# Patient Record
Sex: Female | Born: 1965 | Race: White | Hispanic: No | Smoking: Current every day smoker
Health system: Southern US, Community
[De-identification: ages and names within clinical notes are randomized; demographics above are authoritative.]

---

## 1999-11-19 ENCOUNTER — Encounter: Payer: Self-pay | Admitting: Obstetrics and Gynecology

## 1999-11-19 ENCOUNTER — Inpatient Hospital Stay (HOSPITAL_COMMUNITY): Admission: AD | Admit: 1999-11-19 | Discharge: 1999-11-19 | Payer: Self-pay | Admitting: Obstetrics and Gynecology

## 1999-12-22 ENCOUNTER — Other Ambulatory Visit: Admission: RE | Admit: 1999-12-22 | Discharge: 1999-12-22 | Payer: Self-pay | Admitting: Obstetrics & Gynecology

## 2000-04-18 ENCOUNTER — Inpatient Hospital Stay (HOSPITAL_COMMUNITY): Admission: AD | Admit: 2000-04-18 | Discharge: 2000-04-18 | Payer: Self-pay | Admitting: Obstetrics & Gynecology

## 2000-04-19 ENCOUNTER — Inpatient Hospital Stay (HOSPITAL_COMMUNITY): Admission: AD | Admit: 2000-04-19 | Discharge: 2000-04-19 | Payer: Self-pay | Admitting: Obstetrics & Gynecology

## 2000-06-17 ENCOUNTER — Encounter: Payer: Self-pay | Admitting: Obstetrics and Gynecology

## 2000-06-17 ENCOUNTER — Observation Stay (HOSPITAL_COMMUNITY): Admission: AD | Admit: 2000-06-17 | Discharge: 2000-06-18 | Payer: Self-pay | Admitting: Obstetrics and Gynecology

## 2000-06-18 ENCOUNTER — Inpatient Hospital Stay (HOSPITAL_COMMUNITY): Admission: AD | Admit: 2000-06-18 | Discharge: 2000-06-18 | Payer: Self-pay | Admitting: Obstetrics and Gynecology

## 2000-07-01 ENCOUNTER — Inpatient Hospital Stay (HOSPITAL_COMMUNITY): Admission: AD | Admit: 2000-07-01 | Discharge: 2000-07-03 | Payer: Self-pay | Admitting: *Deleted

## 2000-07-04 ENCOUNTER — Encounter: Admission: RE | Admit: 2000-07-04 | Discharge: 2000-10-02 | Payer: Self-pay | Admitting: Obstetrics & Gynecology

## 2000-08-05 ENCOUNTER — Other Ambulatory Visit: Admission: RE | Admit: 2000-08-05 | Discharge: 2000-08-05 | Payer: Self-pay | Admitting: Obstetrics & Gynecology

## 2001-09-25 ENCOUNTER — Other Ambulatory Visit: Admission: RE | Admit: 2001-09-25 | Discharge: 2001-09-25 | Payer: Self-pay | Admitting: Obstetrics & Gynecology

## 2001-11-26 ENCOUNTER — Encounter: Admission: RE | Admit: 2001-11-26 | Discharge: 2001-11-26 | Payer: Self-pay | Admitting: Internal Medicine

## 2001-11-26 ENCOUNTER — Encounter: Payer: Self-pay | Admitting: Internal Medicine

## 2001-12-03 ENCOUNTER — Encounter: Payer: Self-pay | Admitting: Surgery

## 2001-12-03 ENCOUNTER — Observation Stay (HOSPITAL_COMMUNITY): Admission: RE | Admit: 2001-12-03 | Discharge: 2001-12-03 | Payer: Self-pay | Admitting: Surgery

## 2001-12-03 ENCOUNTER — Encounter (INDEPENDENT_AMBULATORY_CARE_PROVIDER_SITE_OTHER): Payer: Self-pay | Admitting: Specialist

## 2002-11-17 ENCOUNTER — Other Ambulatory Visit: Admission: RE | Admit: 2002-11-17 | Discharge: 2002-11-17 | Payer: Self-pay | Admitting: Obstetrics & Gynecology

## 2003-12-17 ENCOUNTER — Other Ambulatory Visit: Admission: RE | Admit: 2003-12-17 | Discharge: 2003-12-17 | Payer: Self-pay | Admitting: Obstetrics & Gynecology

## 2004-08-22 ENCOUNTER — Observation Stay (HOSPITAL_COMMUNITY): Admission: AD | Admit: 2004-08-22 | Discharge: 2004-08-23 | Payer: Self-pay | Admitting: Obstetrics & Gynecology

## 2004-08-22 ENCOUNTER — Encounter (INDEPENDENT_AMBULATORY_CARE_PROVIDER_SITE_OTHER): Payer: Self-pay | Admitting: Specialist

## 2005-01-26 ENCOUNTER — Encounter: Admission: RE | Admit: 2005-01-26 | Discharge: 2005-01-26 | Payer: Self-pay | Admitting: Internal Medicine

## 2007-06-25 ENCOUNTER — Emergency Department (HOSPITAL_COMMUNITY): Admission: EM | Admit: 2007-06-25 | Discharge: 2007-06-25 | Payer: Self-pay | Admitting: Emergency Medicine

## 2008-10-21 ENCOUNTER — Encounter: Admission: RE | Admit: 2008-10-21 | Discharge: 2008-10-21 | Payer: Self-pay | Admitting: Internal Medicine

## 2009-02-03 ENCOUNTER — Inpatient Hospital Stay (HOSPITAL_COMMUNITY): Admission: EM | Admit: 2009-02-03 | Discharge: 2009-02-06 | Payer: Self-pay | Admitting: Emergency Medicine

## 2010-01-23 ENCOUNTER — Emergency Department (HOSPITAL_COMMUNITY): Admission: EM | Admit: 2010-01-23 | Discharge: 2010-01-23 | Payer: Self-pay | Admitting: Emergency Medicine

## 2010-03-01 IMAGING — CT CT PELVIS W/ CM
2 of 5 series · 13 of 32 positions shown, 18 images · IV contrast (water- bmi proto & 100 ML OMNI 300)
Comparison: 01/26/2005.

CT ABDOMEN

CLINICAL DATA: Left lower quadrant pain, nausea, vomiting, fever,
headache, question diverticulitis

CT ABDOMEN AND PELVIS WITH CONTRAST
TECHNIQUE: Multidetector CT imaging of the abdomen and pelvis was
performed using the standard protocol following bolus
administration of intravenous contrast. Sagittal and coronal MPR
images reconstructed from axial data set.
Contrast: Water as oral contrast and 100 ml Zmnipaque-VFF IV.

[Series 2: routine abdomen · axial · 0.91mm/px · z∈[-460,-120]mm · 5 of 103 slices shown, 10 images]
[im 18/103  soft-tissue]
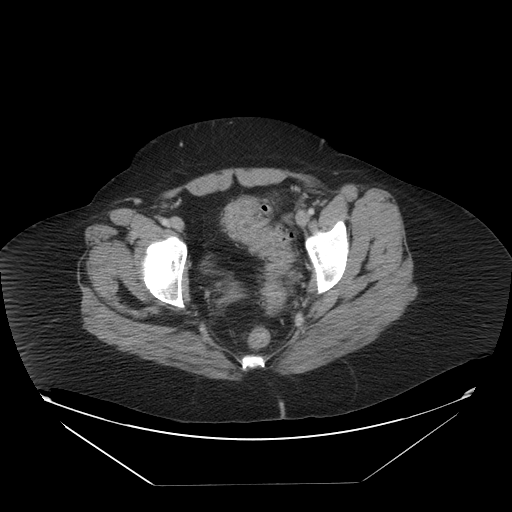
[im 18/103  bone]
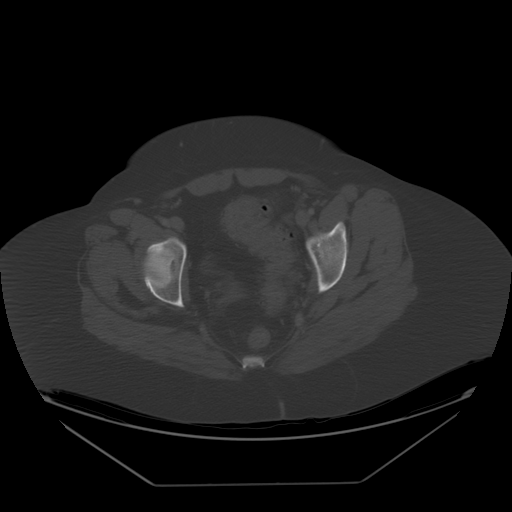
[im 35/103  soft-tissue]
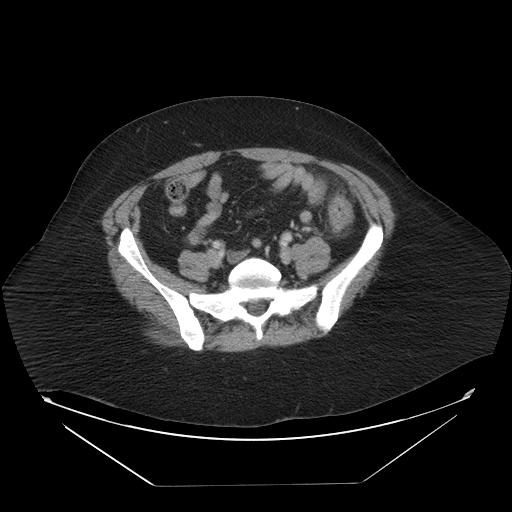
[im 35/103  lung]
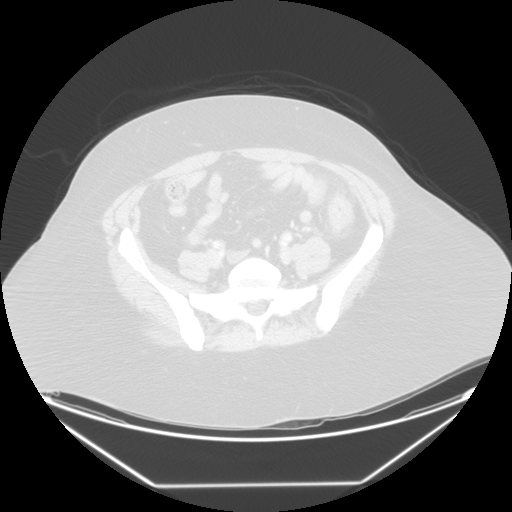
[im 52/103  soft-tissue]
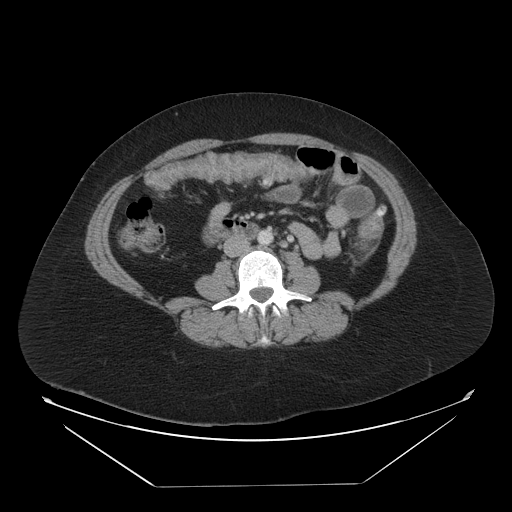
[im 52/103  lung]
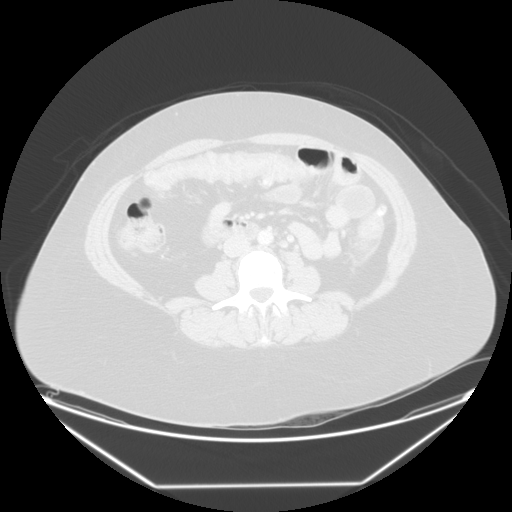
[im 69/103  soft-tissue]
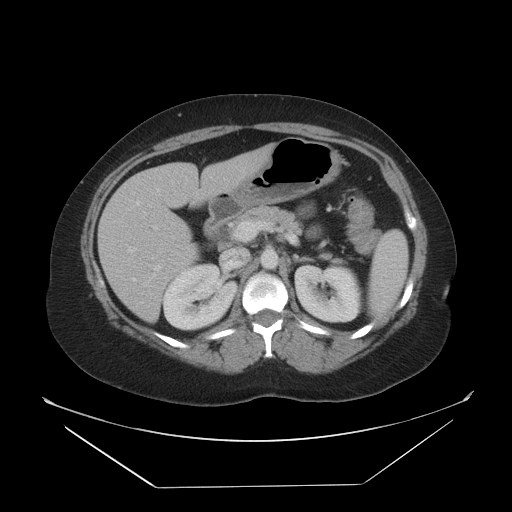
[im 69/103  lung]
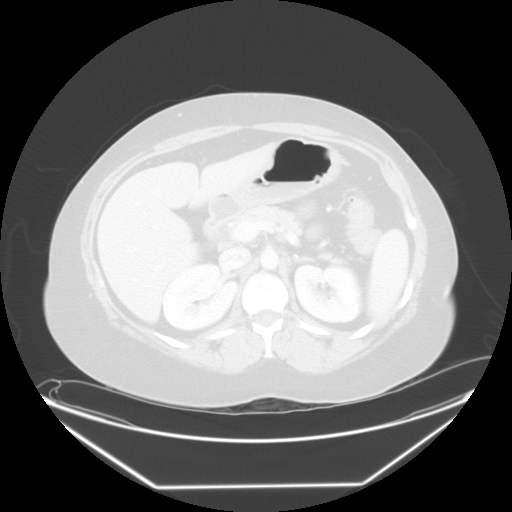
[im 86/103  soft-tissue]
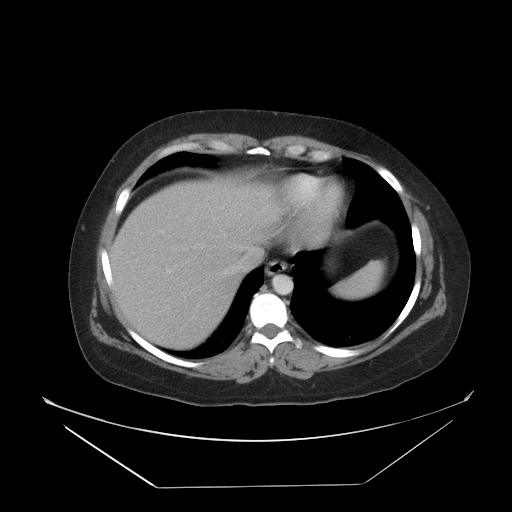
[im 86/103  lung]
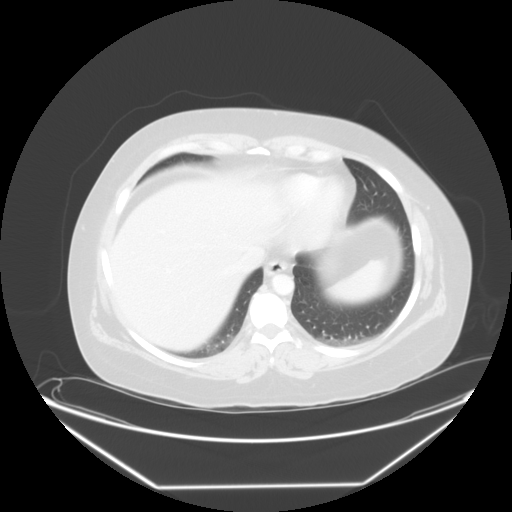

[Series 400: reformatted · sagittal · 1.02mm/px · 8 of 137 slices shown]
[im 16/137  soft-tissue]
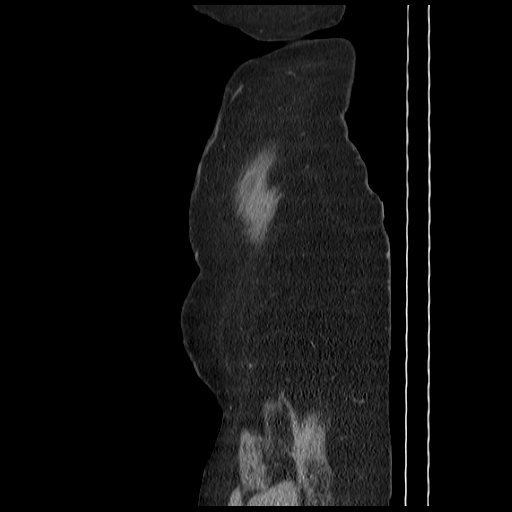
[im 31/137  soft-tissue]
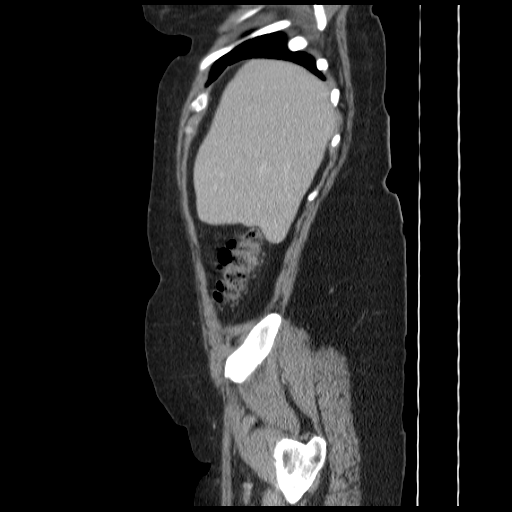
[im 46/137  soft-tissue]
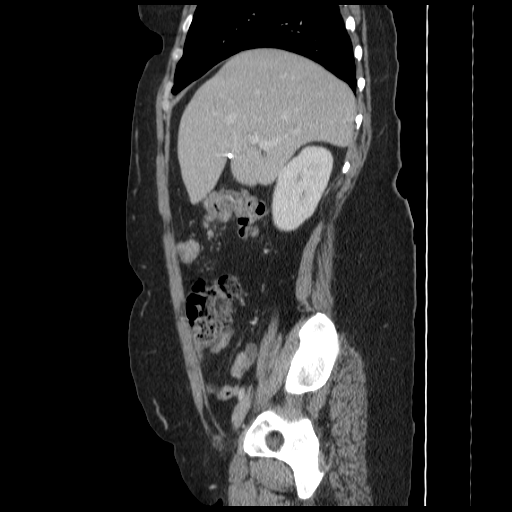
[im 61/137  soft-tissue]
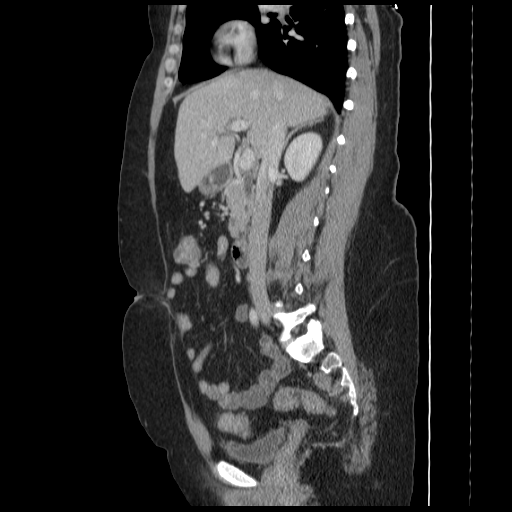
[im 76/137  soft-tissue]
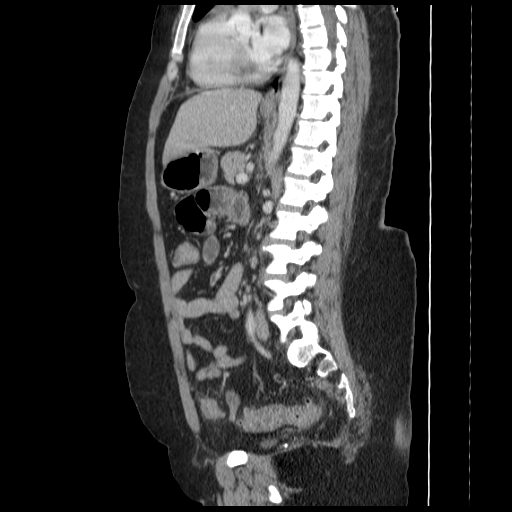
[im 91/137  soft-tissue]
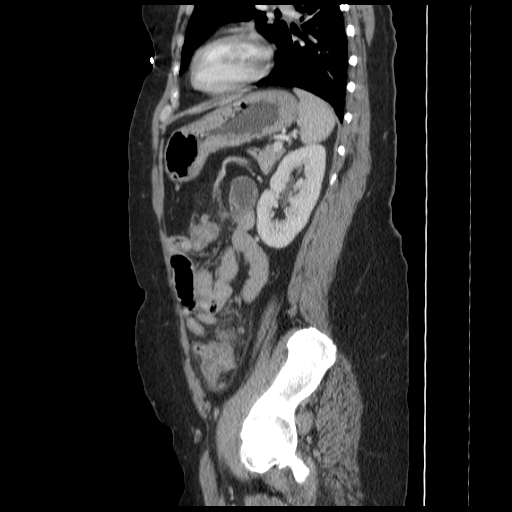
[im 106/137  soft-tissue]
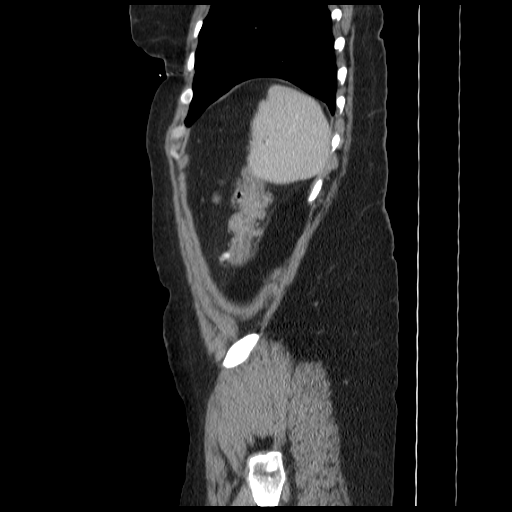
[im 121/137  soft-tissue]
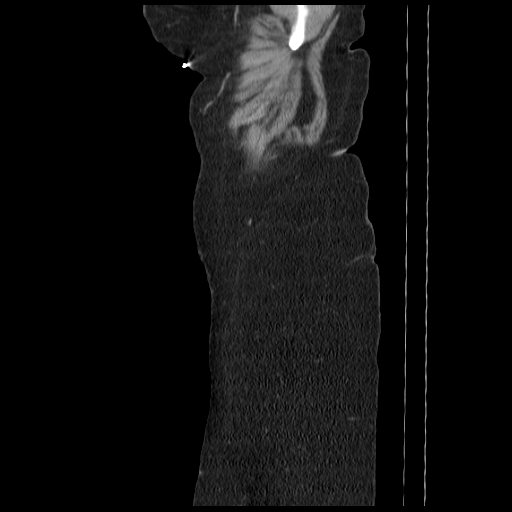

[13 of 32 positions shown; findings below may reference images not displayed]

FINDINGS: Minimal dependent density at lung bases bilaterally.
Status post cholecystectomy.
Liver, spleen, pancreas, kidneys, and adrenal glands normal.
Retroaortic left renal vein incidentally noted.
Scattered diverticula of transverse, descending, and sigmoid colon.
However, in addition, diffuse wall thickening of colon is
identified from proximal transverse colon into sigmoid colon.
Length of involvement makes this unlikely to represent
diverticulitis and is most suspicious for colitis.
Intra abdominal vascular structures patent.
No definite evidence of perforation or abscess.
Scattered pericolic edema/infiltrative changes noted.
Small bowel loops unremarkable.
No mass, adenopathy, or ascites.
IMPRESSION: Diffuse colitis extending from proximal transverse colon into
sigmoid colon, consider infectious etiologies and inflammatory
bowel disease, ischemia considered unlikely in patient of this age
lacking vascular disease findings.
Scattered colonic diverticula though the length of involvement of
the colonic wall edema/thickening makes this unlikely to represent
acute diverticulitis.

CT PELVIS
FINDINGS: Colonic wall thickening extends from distal descending colon
through sigmoid colon.
Scattered colonic diverticula.
Pericolic inflammatory changes without discrete abscess or extra
colonic gas.
Small bowel loops appendix normal.
No pelvic mass, adenopathy, or free fluid.
No focal bony abnormalities.
IMPRESSION: Colitis extending from the proximal transverse through sigmoid
colon, diverticulitis considered unlikely due to length of
involvement, see discussion and CT abdomen impression.

## 2011-02-05 LAB — URINALYSIS, ROUTINE W REFLEX MICROSCOPIC
Bilirubin Urine: NEGATIVE
Glucose, UA: 100 mg/dL — AB
Ketones, ur: NEGATIVE mg/dL
Protein, ur: NEGATIVE mg/dL
Urobilinogen, UA: 0.2 mg/dL (ref 0.0–1.0)

## 2011-02-05 LAB — CBC
Hemoglobin: 14.3 g/dL (ref 12.0–15.0)
MCHC: 34.5 g/dL (ref 30.0–36.0)
MCV: 96.3 fL (ref 78.0–100.0)
RBC: 4.3 MIL/uL (ref 3.87–5.11)
WBC: 14.4 10*3/uL — ABNORMAL HIGH (ref 4.0–10.5)

## 2011-02-05 LAB — DIFFERENTIAL
Eosinophils Absolute: 0 10*3/uL (ref 0.0–0.7)
Lymphs Abs: 0.7 10*3/uL (ref 0.7–4.0)
Monocytes Absolute: 0.5 10*3/uL (ref 0.1–1.0)
Monocytes Relative: 3 % (ref 3–12)
Neutrophils Relative %: 92 % — ABNORMAL HIGH (ref 43–77)

## 2011-02-05 LAB — BASIC METABOLIC PANEL
CO2: 25 mEq/L (ref 19–32)
Chloride: 105 mEq/L (ref 96–112)
Creatinine, Ser: 0.74 mg/dL (ref 0.4–1.2)
GFR calc Af Amer: >60 mL/min (ref >60)
Potassium: 3.7 mEq/L (ref 3.5–5.1)
Sodium: 137 mEq/L (ref 135–145)

## 2011-02-05 LAB — URINE MICROSCOPIC-ADD ON

## 2011-02-22 LAB — BASIC METABOLIC PANEL
BUN: 1 mg/dL — ABNORMAL LOW (ref 6–23)
BUN: <1 mg/dL — ABNORMAL LOW (ref 6–23)
CO2: 29 mEq/L (ref 19–32)
Calcium: 8.5 mg/dL (ref 8.4–10.5)
Chloride: 107 mEq/L (ref 96–112)
Creatinine, Ser: 0.45 mg/dL (ref 0.4–1.2)
GFR calc Af Amer: >60 mL/min (ref >60)
GFR calc non Af Amer: >60 mL/min (ref >60)
GFR calc non Af Amer: >60 mL/min (ref >60)
Glucose, Bld: 102 mg/dL — ABNORMAL HIGH (ref 70–99)
Glucose, Bld: 192 mg/dL — ABNORMAL HIGH (ref 70–99)
Potassium: 3.9 mEq/L (ref 3.5–5.1)
Sodium: 139 mEq/L (ref 135–145)
Sodium: 140 mEq/L (ref 135–145)

## 2011-02-22 LAB — URINALYSIS, ROUTINE W REFLEX MICROSCOPIC
Bilirubin Urine: NEGATIVE
Glucose, UA: NEGATIVE mg/dL
Protein, ur: NEGATIVE mg/dL
Specific Gravity, Urine: 1.026 (ref 1.005–1.030)

## 2011-02-22 LAB — POCT I-STAT, CHEM 8
BUN: 7 mg/dL (ref 6–23)
Chloride: 102 mEq/L (ref 96–112)
Creatinine, Ser: 0.7 mg/dL (ref 0.4–1.2)
Hemoglobin: 14.6 g/dL (ref 12.0–15.0)
Potassium: 3.7 mEq/L (ref 3.5–5.1)
Sodium: 139 mEq/L (ref 135–145)

## 2011-02-22 LAB — URINE MICROSCOPIC-ADD ON

## 2011-02-22 LAB — PHOSPHORUS: Phosphorus: 2.2 mg/dL — ABNORMAL LOW (ref 2.3–4.6)

## 2011-02-22 LAB — RAPID URINE DRUG SCREEN, HOSP PERFORMED
Barbiturates: NOT DETECTED
Opiates: POSITIVE — AB

## 2011-02-22 LAB — COMPREHENSIVE METABOLIC PANEL
Albumin: 2.8 g/dL — ABNORMAL LOW (ref 3.5–5.2)
BUN: 5 mg/dL — ABNORMAL LOW (ref 6–23)
Calcium: 8.2 mg/dL — ABNORMAL LOW (ref 8.4–10.5)
Chloride: 104 mEq/L (ref 96–112)
Creatinine, Ser: 0.55 mg/dL (ref 0.4–1.2)
GFR calc non Af Amer: >60 mL/min (ref >60)
Total Bilirubin: 0.7 mg/dL (ref 0.3–1.2)

## 2011-02-22 LAB — FECAL LACTOFERRIN, QUANT

## 2011-02-22 LAB — DIFFERENTIAL
Basophils Absolute: 0 10*3/uL (ref 0.0–0.1)
Lymphocytes Relative: 24 % (ref 12–46)
Lymphocytes Relative: 9 % — ABNORMAL LOW (ref 12–46)
Lymphs Abs: 1.4 10*3/uL (ref 0.7–4.0)
Monocytes Absolute: 0.4 10*3/uL (ref 0.1–1.0)
Neutro Abs: 13.6 10*3/uL — ABNORMAL HIGH (ref 1.7–7.7)
Neutro Abs: 3.9 10*3/uL (ref 1.7–7.7)
Neutrophils Relative %: 85 % — ABNORMAL HIGH (ref 43–77)

## 2011-02-22 LAB — CBC
Hemoglobin: 12.1 g/dL (ref 12.0–15.0)
Platelets: 207 10*3/uL (ref 150–400)
Platelets: 248 10*3/uL (ref 150–400)
RDW: 12.5 % (ref 11.5–15.5)
WBC: 16 10*3/uL — ABNORMAL HIGH (ref 4.0–10.5)

## 2011-02-22 LAB — CLOSTRIDIUM DIFFICILE EIA

## 2011-02-22 LAB — STOOL CULTURE

## 2011-02-22 LAB — URINE CULTURE

## 2011-02-22 LAB — CULTURE, BLOOD (ROUTINE X 2)

## 2011-02-22 LAB — CARDIAC PANEL(CRET KIN+CKTOT+MB+TROPI)
Relative Index: INVALID (ref 0.0–2.5)
Total CK: 15 U/L (ref 7–177)
Troponin I: 0.03 ng/mL (ref 0.00–0.06)

## 2011-02-22 LAB — APTT: aPTT: 36 seconds (ref 24–37)

## 2011-02-22 LAB — MAGNESIUM
Magnesium: 1.7 mg/dL (ref 1.5–2.5)
Magnesium: 1.9 mg/dL (ref 1.5–2.5)

## 2011-02-22 LAB — LIPID PANEL
HDL: 33 mg/dL — ABNORMAL LOW (ref >39)
LDL Cholesterol: 105 mg/dL — ABNORMAL HIGH (ref 0–99)
Triglycerides: 73 mg/dL (ref <150)

## 2011-02-22 LAB — PROTIME-INR: Prothrombin Time: 14.4 seconds (ref 11.6–15.2)

## 2011-02-22 LAB — HOMOCYSTEINE: Homocysteine: 3.1 umol/L — ABNORMAL LOW (ref 4.0–15.4)

## 2011-02-22 LAB — HEMOGLOBIN A1C: Mean Plasma Glucose: 120 mg/dL

## 2011-03-27 NOTE — Discharge Summary (Signed)
[name], [name]               ACCOUNT NO.:  [id number]   MEDICAL RECORD NO.:  [id number]          PATIENT TYPE:  INP   LOCATION:  6704                         FACILITY:  [facility]   PHYSICIAN:  Charlestine Massed, MDDATE OF BIRTH:  Nov 02, 1966   DATE OF ADMISSION:  02/02/2009  DATE OF DISCHARGE:                               DISCHARGE SUMMARY   PRIMARY CARE PHYSICIAN:  Dr. Elmore Guise.   REASON FOR ADMISSION:  Abdominal pain and diarrhea.   DISCHARGE DIAGNOSES:  1. Clostridium difficile infection.  2. Pancolitis, resolving.  3. Existing diagnosis of depression.  4. Existing of esophageal reflux disease.  5. Tobacco abuse - patient willing to stop smoking.   DISCHARGE MEDICATIONS:  1. Vancomycin 250 mg p.o. 4 x a day for 11 days.  2. Flagyl 500 mg p.o. t.i.d. for 11 days.  3. Nicotine patch 14 mg per day for 10 days and then 5 mg for 10 days      and then stop.  4. Mucinex 600 mg p.o. b.i.d.  5. Continue the existing medications of Effexor 300 mg p.o. daily.  6. BuSpar 15 mg p.o. b.i.d.  7. Nexium 40 mg p.o. daily.  8. Neurontin 600 mg t.i.d.  9. Potassium chloride 20 meq PO daily for 5 days  10.Magnesium Oxide 400 mg P.O BID for 5 days.   MEDICATIONS STOPPED:  Diamox.   HOSPITAL COURSE:  1. Clostridium difficile infection with pancolitis.  The patient was      initially admitted as she complained of a possible stomach virus      with worsening abdominal pain.  She was evaluated in the emergency      room and she was found to have nausea, vomiting and diarrhea with      fever.  A CAT scan of the abdomen showed diffuse colitis throughout      the colon.  She remembers having a similar episode 15 years ago.      At that time, she was treated with antibiotics.  So, the patient      was started on antibiotics. The patient stated that she took      antibiotics for upper respiratory infection/lower respiratory      infection one week ago.  Clostridium difficile infection was    suspected because of the recent antibiotic intake and also because      of the presence of diffuse pancolitis on CAT scan, which is usually      a classical feature of C diff colitis.  Her stool studies became      positive for C diff colitis and so the patient was started on      Flagyl.  She was continued on the Flagyl for fever, which she was      started on at admission even though the pancolitis picture on the      patient with diffuse tenderness and the high potential of      Clostridium difficile infection with pancolitis to develop into      fulminant sepsis which has a high mortality rate. and so she was  also started on vancomycin p.o. in addition to Flagyl considering      the severity of the infection.  The patient recovered considerably      with antibiotics.  Abdominal pain decreased considerably.  She has      been taking p.o. feeds.  Electrolytes have been stable so far and      so the patient is being discharged home on this medication as      mentioned.  2. History of gastroesophageal reflux disease:  Continue Nexium.  3. History of depression:  Continue Effexor and BuSpar which she has      taken before and also continue the Neurontin which she was      prescribed before.  4. Electrolyte issues:  The patient has been holding to normal      electrolyte levels, but she has been getting continuous replacement      by p.o. so we will continue replacement at home for the next few      days.   DISCHARGE LABS:  WBC:  CBC 5.9, hemoglobin 12.1, hematocrit 34.7 and  platelets 207.  Neutrophils 66%.  WBC was 16,000 with 85% neutrophils at  the time of admission.  BMP:  Sodium 140, potassium 4.3, chloride 107,  bicarb 30, BUN less than 1, creatinine 0.41 and glucose 102.  Calcium  8.5, magnesium 1.7.   Lipids done during the admission:  Total cholesterol 153, LDL 105,  triglycerides 73, HDL 33.  Homocystine level was 3.1 which is low.   The patient has been advised  proper diet intake and to avoid smoking and  the patient agrees with the diet and exercise for that.   DISCHARGE DIAGNOSES:  The patient is discharged back home to follow up  with Dr. Elmore Guise within 2 to 3 days.  He needs to get lab work for [facility]  and magnesium and phosphorus at his office in 3 days.   DISCHARGE INSTRUCTIONS:  1. Take plenty of p.o. fluids.  She has been advised to drink      Gatorade.  2. Take the electrolyte supplements prescribed and also to keep well      hydrated.  3. Follow up with Dr. Chilton Si within 2 to 3 days for lab tests.   A total of 45 minutes was spent on this discharge.      Charlestine Massed, MD  Electronically Signed     UT/MEDQ  D:  02/06/2009  T:  02/06/2009  Job:  [id number]   cc:   Renae Fickle, MD Chilton Si

## 2011-03-27 NOTE — H&P (Signed)
[name], [name]               ACCOUNT NO.:  [id number]   MEDICAL RECORD NO.:  [id number]          PATIENT TYPE:  INP   LOCATION:  6704                         FACILITY:  [facility]   PHYSICIAN:  Carlena Hurl, MDDATE OF BIRTH:  01/22/1966   DATE OF ADMISSION:  02/02/2009  DATE OF DISCHARGE:                              HISTORY & PHYSICAL   CHIEF COMPLAINT:  Stomach virus for the past 3 days.   HISTORY OF PRESENT ILLNESS:  This is a 45 year old Caucasian lady who  has the past medical history significant for colitis about 15 years ago,  and history of GERD now coming in with 3 days episode of nausea,  vomiting, and diarrhea.  History is obtained from the patient.  According to her, on Tuesday, that is 3 days ago, when she got up in the  morning, she started having loose stools, and also continued with loose  stools.  She continued to vomit as well.  She says that whatever she  ate, just coming out through vomitus, and she almost had about 10-12  episodes of loose stools per day, and for the past 3 days.  She has not  noticed any blood in the stools and initially she noticed diffuse  abdominal pain along with the diarrhea, and vomiting, and later her  abdominal pain settled down in the left lower quadrant.  She denies any  travel history recently.  Denies eating any different food, and she  denies having any contacts with sick people recently.  She says that  about 15 years ago, she had similar symptoms of nausea, vomiting, and  diarrhea and had colonoscopy with the GI physician, and was told to have  colitis.  She does not remember exactly what type of colitis, and she  remembers that she was given antibiotics, and then she felt better.  After 15 years now, she again has similar symptoms according to her, and  she also had temperature at home, and she did not take anything for her  temperature as well as for pain, and decided to come to the ER as it is  not getting  better.   PAST MEDICAL HISTORY:  Significant for depression and GERD.   PAST SURGICAL HISTORY:  Cholecystectomy, nonunion fracture of the right  arm, and history of abdominal hysterectomy.   ALLERGIES:  She is allergic to MORPHINE which causes itching.   FAMILY HISTORY:  History of breast cancer in her paternal grandmother,  heart attack in her maternal grandfather, stroke in her maternal  grandmother, and diabetes in her maternal grandmother.   SOCIAL HISTORY:  The patient lives at home with her son, 77-year-old son,  and she smokes about 1pack per day for the past 26 years.  She denies  alcohol or IV drug abuse.   REVIEW OF SYSTEMS:  It is pretty much the same as history of present  illness.   PHYSICAL EXAMINATION:  GENERAL:  This is a 45 year old obese Caucasian  lady who is lying comfortably on the bed without any severe abdominal  pain, or chest pain, or shortness of breath.  VITALS:  When she came to the ER, blood pressure 116/74, pulse rate of  102, respirations 18, temperature 99.3, saturating 94% on room air.  HEENT:  Head is atraumatic normocephalic.  PERRLA.  Tympanic membrane  intact.  No discharge from eyes or ears.  LUNGS:  Clear to auscultation bilaterally.  CVS:  S1, S2 heard.  Regular rate and rhythm.  ABDOMEN:  Soft, bowel sounds present.  There is significant tenderness  present on the left lower quadrant with mild tenderness diffusely, and  there is no guarding, or no rigidity, and no rebound tenderness on the  left lower quadrant.  EXTREMITIES:  No pedal edema noted.  PULSE:  Palpable bilaterally.   LABORATORY DATA:  Ionized calcium of 1.13, WBC 16, hemoglobin 14.6,  hematocrit 42.3, platelets of 248, neutrophil percentage of 85, and  lymphocyte percentage of 9, and neutrophil absolute 13.6.  Sodium 139,  potassium 3.7, chloride 102, bicarb 117, BUN of 7, creatinine 0.7. and  she had a urinalysis done which showed small amount of blood but  otherwise not  significant.  The patient had a CT abdomen and pelvis  which showed diffuse colitis extending from  proximal transverse colon  into the sigmoid colon considering infectious etiologies, inflammatory  bowel disease, and she also found to have scattered colonic diverticula  throughout the length of the involvement of colonic wall edema or  thickening is unlikely to represent acute diverticulitis.   ASSESSMENT AND PLA NAND:  A 45 year old lady with history of  gastroesophageal reflux disease and history of colitis 15 years ago, now  coming in with nausea, vomiting, and diarrhea with fever and CT abdomen  showing diffuse colitis.  1. Acute colitis.  At this time, the CT showed diffuse colitis      extending from proximal transverse colon into sigmoid colon.  This      patient had a similar episode about 15 years ago, and according,      had a colonoscopy which showed colitis, and was given antibiotics.      For now, as the patient had fever and CT showing colitis we will      start this patient on IV Ciprofloxacin 250 mg 2 time a day and IV      Flagyl 500 mg 3 time a day and once the acute infection resolves,      the patient might need one more repeat colonoscopy as an outpatient      for further work up of her colitis.  2. Nausea and vomiting.  At this time, it is secondary to colitis.      The patient will be given Zofran as needed for her nausea and      vomiting, and diarrhea.  This is secondary to colitis and the      patient will be treated symptomatically.  3. Gastroesophageal reflux disease, as the patient cannot take any PO      medications, we will give her IV Protonix 40 mg one time a day.  4. Depression, as the patient cannot take orally because of nausea,      vomiting, we will hold all the medications at this time.  5. Deep venous thrombosis prophylaxis.  We will give her Lovenox subcu      40 mg one time a day.      Carlena Hurl, MD  Electronically  Signed     JD/MEDQ  D:  02/03/2009  T:  02/03/2009  Job:  [id number]

## 2011-03-30 NOTE — Op Note (Signed)
[facility]  Patient:    [name], [name] Visit Number: [id number] MRN: [id number]          Service Type: SUR Location: 4W [id number] [id number] Attending Physician:  Katha Cabal Dictated by:   Thornton Park Daphine Deutscher, M.D. Proc. Date: 12/03/01 Admit Date:  12/03/2001 Discharge Date: 12/03/2001   CC:         Erskine Speed, M.D.   Operative Report  PREOPERATIVE DIAGNOSIS:  Chronic cholecystitis, cholelithiasis.  POSTOPERATIVE DIAGNOSIS:  Chronic cholecystitis, cholelithiasis.  PROCEDURE:  Laparoscopic cholecystectomy with intraoperative cholangiogram.  SURGEON:  Thornton Park. Daphine Deutscher, M.D.  ASSISTANT:  Donnie Coffin. Samuella Cota, M.D.  ANESTHESIA:  General.  DESCRIPTION OF PROCEDURE:  [name] is a 45 year old lady who was seen in the office on November 26, 2001 with upper abdominal pain, nausea, vomiting, and gallstones. Informed consent was obtained then regarding a laparoscopic as well as open cholecystectomy.  The patient was taken to room #6 and given general anesthesia. The abdomen was prepped with Betadine and draped sterilely. A longitudinal incision was made down into the umbilicus and through this and through a pursestring suture, a Hasson cannula was introduced without difficulty. The abdomen was insufflated. A 10 mm was placed in the upper midline and two 5 mms laterally. The gallbladder was grasped and elevated. There was a fair amount of inflammatory changes in the Calots triangle region. This area was delineated and a clip was placed up on the gallbladder. The incision was made in the cystic duct and a dynamic cholangiogram with the C-arm was done using the Reddick catheter. This showed prompt flow into the duodenum. Intrahepatic filling was noted and there were no filling defects. The cystic duct was triple clipped, divided; the cystic arteries triple clipped, divided and then the gallbladder was removed from the gallbladder bed using the hook  cautery. I did not encounter any bleeding or bile leaks to speak of. The gallbladder remained intact throughout the procedure and then once detached and the gallbladder was inspected and the gallbladder bed, and again no bleeding or bile leaks were noted. The gallbladder was removed through the umbilicus and when it was halfway in and out, I went in and retrieved with the stump forcep numerous cholesterol-type stones. The gallbladder was then removed in toto with all the stones. No spillage occurred. The umbilical port was tied down and area was cleaned and injected with the 0.5% Marcaine. The gallbladder bed was reinspected and again, all port sites were without evidence of bleeding. All port sites were injected with Marcaine and then the abdomen was deflated. The wounds were closed with 4-0 Vicryl subcutaneously with benzoin and Steri-Strips. The patient seemed to tolerate the procedure well and was taken to the recovery room in satisfactory condition. Dictated by:   Thornton Park Daphine Deutscher, M.D. Attending Physician:  Katha Cabal DD:  12/03/01 TD:  12/04/01 Job: [id number]

## 2011-03-30 NOTE — Discharge Summary (Signed)
NAMEGENA, LASKI               ACCOUNT NO.:  [id number]   MEDICAL RECORD NO.:  [id number]          PATIENT TYPE:  OBV   LOCATION:  9309                          FACILITY:  [facility]   PHYSICIAN:  Freddy Finner, M.D.   DATE OF BIRTH:  1966/04/15   DATE OF ADMISSION:  08/22/2004  DATE OF DISCHARGE:  08/23/2004                                 DISCHARGE SUMMARY   DISCHARGE DIAGNOSES:  Uterine leiomyomata, uterine adenomyosis, clinical  diagnoses of severe dysmenorrhea, pelvic pain.   OPERATIVE PROCEDURE:  Laparoscopically assisted vaginal hysterectomy.   SURGEON:  Dr. Jennette Kettle.   ANESTHESIA:  General.   INTRAOPERATIVE COMPLICATIONS:  None.   DISPOSITION:  The patient is in satisfactory improved condition at the time  of her discharge.  She is to have progressively increasing physical activity  with no vaginal entry or heavy lifting.  She is to call for fevers, severe  pain or heavy bleeding.  She is to take a regular diet as tolerated.   DISCHARGE MEDICATIONS:  1.  Percocet 5/325 for postoperative pain.  2.  Benadryl 25-50 mg every six hours as needed for itching.  3.  Naprosyn to supplement Percocet as needed.   FOLLOW UP:  Follow up is in two weeks.   DETAILS OF PRESENT ILLNESS/PAST HISTORY/FAMILY HISTORY/REVIEW OF  SYSTEMS/PHYSICAL EXAMINATION:  Recorded in the admission note.   Briefly, the patient is having significant clinical symptoms of menorrhagia,  is known to have uterine fibroids, has severe dysmenorrhea and pelvic pain.   Her physical findings on admission were remarkable for the pelvic findings  of enlargement of the uterus.   LABORATORY DATA:  During this admission includes a normal CBC on admission  with a hemoglobin of 14.4, postoperative hemoglobin is 11.5.  Preoperative  chemistry profile was normal.  Admission urinalysis was normal.   HOSPITAL COURSE:  The patient was admitted on the morning of surgery.  She  was treated perioperatively with antibiotics and  PISO's.  The above-  described surgical procedure was accomplished without intraoperative  complications.  Her postoperative course was uncomplicated.  She remained  afebrile throughout her postoperative stay.  By the afternoon of the first  postoperative day, her condition was considered to be good.  She was  discharged home with disposition as noted above.     Hosie Spangle   WRN/MEDQ  D:  09/12/2004  T:  09/12/2004  Job:  [id number]

## 2011-03-30 NOTE — Op Note (Signed)
[name], [name]               ACCOUNT NO.:  [id number]   MEDICAL RECORD NO.:  [id number]          PATIENT TYPE:  OBV   LOCATION:  9309                          FACILITY:  [facility]   PHYSICIAN:  Freddy Finner, M.D.   DATE OF BIRTH:  [birth year]   DATE OF PROCEDURE:  08/22/2004  DATE OF DISCHARGE:                                 OPERATIVE REPORT   PREOPERATIVE DIAGNOSES:  1.  Uterine fibroids.  2.  Menorrhagia.  3.  Severe dysmenorrhea.  4.  Pelvic pain; suspected adenomyosis.   POSTOPERATIVE DIAGNOSES:  1.  Uterine fibroids.  2.  Uterine enlargement.  3.  No evidence of endometriosis.   PROCEDURE:  Laparoscopically assisted vaginal hysterectomy.   SURGEON:  Freddy Finner, M.D.   ASSISTANT:  Raynald Kemp, M.D.   ESTIMATED INTRAOPERATIVE BLOOD LOSS:  150 mL.   ANESTHESIA:  General.   INTRAOPERATIVE COMPLICATIONS:  None.   FINDINGS:  The uterus was enlarged with subserosal myomas.  There was no  evidence of pelvic endometriosis.  Tubes and ovaries were normal.  The  appendix was not visualized.  Upper abdomen revealed no apparent  abnormalities.   DESCRIPTION OF PROCEDURE:  The patient was admitted on the morning of  surgery.  She was given a bolus of antibiotic IV preoperatively.  She was  placed in PAS compression hose.  She was brought to the operating room and  there placed under adequate general endotracheal anesthesia and placed in  the dorsal lithotomy position using Allen stirrup system.  Betadine prep was  carried out followed by solution in the usual fashion.  The bladder was  evacuated with the [facility] catheter.  Hulka tenaculum was attached to the  cervix under direct visualization.  Sterile drapes were applied.  Two small  abdominal incisions were made, one at the umbilicus and one just above the  symphysis in the midline.  An 11 mm disposable trocar was introduced to the  umbilicus while elevating the anterior abdominal wall manually.  Direct  inspection  revealed adequate placement with no evidence of injury on entry.  Pneumoperitoneum was allowed to accumulate with carbon dioxide gas.  A 5 mm  trocar was placed through the lower incision under direct visualization.  A  blunt probe was used through this trocar sleeve.  Systematic examination of  pelvic and abdominal contents was carried out.  Findings were noted above.  Using the tripolar device through the operating channel of the laparoscope,  this was a __________ device.  The utero-ovarian pedicles, fallopian tubes,  round ligaments and upper broad ligament were taken progressively, sealed  and divided sharply.  This was carried to a level just above the uterine  arteries on each side.  Attention was turned vaginally.  The posterior  weighted retractor was placed.  Hulka tenaculum and Jacobs tenaculum applied  to the cervix.  Colpotomy incision was made while tenting the mucosa  posterior to the cervix with an Allis.  The cervix was circumscribed to the  scalpel.  Using the Gyrus Haney saw clamp, the uterosacral pedicles were  each sealed and  divided.  The bladder pillars were sealed and divided.  The  bladder was carefully advanced out of the anterior cervix and lower uterine  segment.  The anterior peritoneum was entered.  Cardinal ligament pedicles  were taken, sealed and divided using the Gyrus clamp.  The vessel pedicles  were taken similarly, sealed and divided.  The uterus was then easily  delivered through the vaginal introitus.  Angles of the vagina were anchored  to the uterosacrals with mattress sutures of 0 Monocryl.  Uterosacrals were  plicated.  Posterior peritoneum was closed with interrupted 0 Monocryl  suture.  The cuff was closed with figure-of-eight of 0 Monocryl vertically.  Reinspection laparoscopically was carried out.  The Nezhat irrigation probe  was used and careful examination of all pedicles revealed complete  hemostasis.  Bloody material and irrigating  solution was aspirated from the  abdomen.  All of the gas was allowed to escape from the abdomen.  The  abdominal incisions were closed with interrupted subcuticular sutures of 3-0  Dexon.  Then 0.25% plain Marcaine was injected at the incision site for  postoperative analgesia.  Steri-Strips were applied in the lower incision.  The patient was awakened and taken to recovery in good condition.      WRN/MEDQ  D:  08/22/2004  T:  08/22/2004  Job:  [id number]

## 2011-03-30 NOTE — H&P (Signed)
[name], CHEEK               ACCOUNT NO.:  [id number]   MEDICAL RECORD NO.:  [id number]          PATIENT TYPE:  OBV   LOCATION:  NA                            FACILITY:  [facility]   PHYSICIAN:  Freddy Finner, M.D.   DATE OF BIRTH:  Aug 08, 1966   DATE OF ADMISSION:  08/22/2004  DATE OF DISCHARGE:                                HISTORY & PHYSICAL   ADMISSION DIAGNOSES:  Fibroid, menorrhagia, chronic severe dysmenorrhea and  pelvic pain, suspected endometriosis and/or adenomyosis.   POSTOPERATIVE DIAGNOSES:  Fibroid, menorrhagia, chronic severe dysmenorrhea  and pelvic pain, suspected endometriosis and/or adenomyosis.   The patient is a 45 year old white single female, gravida 3, para 1 with  progressively increasing menorrhagia, a normal sonohysterogram except for  the presence of a fundal fibroid and severe dysmenorrhea and pelvic pain.  Options of therapy including laparoscopy with hysteroscopy D&C and Novasure  have been discussed with the patient. She has requested definitive surgical  intervention and is now admitted for laparoscopically assisted vaginal  hysterectomy.  She has reviewed a video in the office describing the  operative procedure including potential risk of the procedure and she is  prepared to proceed.   He current review of systems is otherwise negative, no cardiopulmonary, GI  or GU complaints.   PAST MEDICAL HISTORY:  The patient is noted to have reflux for which she  takes Nexium 40 mg q.d..  She is noted to have depression for which she  takes Effexor XL 300 mg q.d., she takes valium 5 mg on a p.r.n. basis. She  has __________ which she takes on a daily basis.   She is a cigarette smoker.   She has never had a blood transfusion.   She has no known allergies to medications.   FAMILY HISTORY:  Remarkable for breast cancer in a paternal grandmother,  heart attack in a maternal grandfather, stroke in a maternal grandmother and  diabetes in a maternal  grandmother.   PHYSICAL EXAMINATION:  HEENT:  Grossly within normal limits.  VITAL SIGNS:  Blood pressure in the office 120/80.  CHEST:  Clear to auscultation.  HEART:  Normal sinus rhythm without murmurs, rubs or gallops.  BREAST:  No palpable masses, no nipple discharge, no skin change (baseline  mammogram was negative in December of 2002).  PELVIC:  External genitalia, vagina and cervix are normal to inspection.  Bimanual reveals the uterus to be anterior in position, upper normal in  size.  There are no palpable adnexal masses.  RECTUM:  Palpably normal. Rectovaginal exam confirms the above findings.   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   PLAN:  Laparoscopically assisted vaginal hysterectomy.      WRN/MEDQ  D:  08/21/2004  T:  08/21/2004  Job:  [id number]

## 2011-12-29 ENCOUNTER — Emergency Department (HOSPITAL_COMMUNITY): Payer: BC Managed Care – PPO

## 2011-12-29 ENCOUNTER — Encounter (HOSPITAL_COMMUNITY): Payer: Self-pay | Admitting: *Deleted

## 2011-12-29 ENCOUNTER — Emergency Department (HOSPITAL_COMMUNITY)
Admission: EM | Admit: 2011-12-29 | Discharge: 2011-12-29 | Disposition: A | Discharge status: Home / Self Care | Payer: BC Managed Care – PPO | Attending: Emergency Medicine | Admitting: Emergency Medicine

## 2011-12-29 DIAGNOSIS — Y9229 Other specified public building as the place of occurrence of the external cause: Secondary | ICD-10-CM | POA: Insufficient documentation

## 2011-12-29 DIAGNOSIS — M2530 Other instability, unspecified joint: Secondary | ICD-10-CM

## 2011-12-29 DIAGNOSIS — S82899A Other fracture of unspecified lower leg, initial encounter for closed fracture: Secondary | ICD-10-CM

## 2011-12-29 DIAGNOSIS — Y998 Other external cause status: Secondary | ICD-10-CM | POA: Insufficient documentation

## 2011-12-29 DIAGNOSIS — Z79899 Other long term (current) drug therapy: Secondary | ICD-10-CM | POA: Insufficient documentation

## 2011-12-29 DIAGNOSIS — S82843A Displaced bimalleolar fracture of unspecified lower leg, initial encounter for closed fracture: Secondary | ICD-10-CM | POA: Insufficient documentation

## 2011-12-29 DIAGNOSIS — W1789XA Other fall from one level to another, initial encounter: Secondary | ICD-10-CM | POA: Insufficient documentation

## 2011-12-29 MED ORDER — IBUPROFEN 800 MG PO TABS: 800.0000 mg | ORAL_TABLET | Freq: Three times a day (TID) | ORAL | Status: AC

## 2011-12-29 MED ORDER — DIPHENHYDRAMINE HCL 25 MG PO CAPS
25.0000 mg | ORAL_CAPSULE | Freq: Once | ORAL | Status: AC
  Administered 2011-12-29: 25 mg via ORAL
  Filled 2011-12-29: qty 1

## 2011-12-29 MED ORDER — PERCOCET 5-325 MG PO TABS: 1.0000 | ORAL_TABLET | Freq: Four times a day (QID) | ORAL | Status: AC | PRN

## 2011-12-29 MED ORDER — OXYCODONE-ACETAMINOPHEN 5-325 MG PO TABS
2.0000 | ORAL_TABLET | Freq: Once | ORAL | Status: AC
  Administered 2011-12-29: 2 via ORAL
  Filled 2011-12-29: qty 2

## 2011-12-29 MED ORDER — HYDROMORPHONE HCL PF 2 MG/ML IJ SOLN
2.0000 mg | Freq: Once | INTRAMUSCULAR | Status: AC
  Administered 2011-12-29: 2 mg via INTRAMUSCULAR
  Filled 2011-12-29: qty 1

## 2011-12-29 NOTE — ED Notes (Signed)
CMS intact in all lower extremities

## 2011-12-29 NOTE — Discharge Instructions (Signed)
Call the orthopedic listed in your discharge instructions first thing Monday morning to schedule your followup appointment.  Use medications as prescribed.  Do not operate heavy machinery while on pain medications.  Reconstructions below to learn more about your diagnosis.  Ankle Fracture A fracture is a break in the bone. A cast or splint is used to protect and keep your injured bone from moving.  HOME CARE INSTRUCTIONS   Use your crutches as directed.   To lessen the swelling, keep the injured leg elevated while sitting or lying down.   Apply ice to the injury for 15 to 20 minutes, 3 to 4 times per day while awake for 2 days. Put the ice in a plastic bag and place a thin towel between the bag of ice and your cast.   If you have a plaster or fiberglass cast:   Do not try to scratch the skin under the cast using sharp or pointed objects.   Check the skin around the cast every day. You may put lotion on any red or sore areas.   Keep your cast dry and clean.   If you have a plaster splint:   Wear the splint as directed.   You may loosen the elastic around the splint if your toes become numb, tingle, or turn cold or blue.   Do not put pressure on any part of your cast or splint; it may break. Rest your cast only on a pillow the first 24 hours until it is fully hardened.   Your cast or splint can be protected during bathing with a plastic bag. Do not lower the cast or splint into water.   Take medications as directed by your caregiver. Only take over-the-counter or prescription medicines for pain, discomfort, or fever as directed by your caregiver.   Do not drive a vehicle until your caregiver specifically tells you it is safe to do so.   If your caregiver has given you a follow-up appointment, it is very important to keep that appointment. Not keeping the appointment could result in a chronic or permanent injury, pain, and disability. If there is any problem keeping the appointment,  you must call back to this facility for assistance.  SEEK IMMEDIATE MEDICAL CARE IF:   Your cast gets damaged or breaks.   You have continued severe pain or more swelling than you did before the cast was put on.   Your skin or toenails below the injury turn blue or gray, or feel cold or numb.   There is a bad smell or new stains and/or purulent (pus like) drainage coming from under the cast.  If you do not have a window in your cast for observing the wound, a discharge or minor bleeding may show up as a stain on the outside of your cast. Report these findings to your caregiver. MAKE SURE YOU:   Understand these instructions.   Will watch your condition.   Will get help right away if you are not doing well or get worse.  Document Released: 10/26/2000 Document Revised: 07/11/2011 Document Reviewed: 06/01/2008 [facility] Patient Information 2012 [place], [place].

## 2011-12-29 NOTE — ED Notes (Signed)
Called ortho tech and is on the way.

## 2011-12-29 NOTE — ED Notes (Signed)
Pt in s/p fall c/o right foot pain, swelling, increased pain with walking

## 2011-12-29 NOTE — ED Provider Notes (Addendum)
History     CSN: [id number]  Arrival date & time 12/29/11  1640   First MD Initiated Contact with Patient 12/29/11 1808      Chief Complaint  Patient presents with  . Foot Injury    (Consider location/radiation/quality/duration/timing/severity/associated sxs/prior treatment) HPI Comments: [name] snack at 11:00 AM  Smoker, no DM or hx CA  Patient is a 46 y.o. female presenting with foot injury. The history is provided by the patient.  Foot Injury  The incident occurred 3 to 5 hours ago. The incident occurred at school. The injury mechanism was a fall (slipping down hill ). The pain is present in the right ankle. The quality of the pain is described as aching and sharp. The pain is at a severity of 10/10. The pain is moderate. The pain has been constant since onset. Associated symptoms include inability to bear weight, loss of motion and tingling. Pertinent negatives include no numbness, no muscle weakness and no loss of sensation. She reports no foreign bodies present. The symptoms are aggravated by activity, bearing weight and palpation. She has tried nothing for the symptoms.    History reviewed. No pertinent past medical history.  History reviewed. No pertinent past surgical history.  History reviewed. No pertinent family history.  History  Substance Use Topics  . Smoking status: Not on file  . Smokeless tobacco: Not on file  . Alcohol Use: Not on file    OB History    Grav Para Term Preterm Abortions TAB SAB Ect Mult Living                  Review of Systems  Neurological: Positive for tingling. Negative for numbness.    Allergies  Review of patient's allergies indicates no known allergies.  Home Medications   Current Outpatient Rx  Name Route Sig Dispense Refill  . ESOMEPRAZOLE MAGNESIUM 40 MG PO CPDR Oral Take 40 mg by mouth daily before breakfast.    . GABAPENTIN 600 MG PO TABS Oral Take 600 mg by mouth daily.    . IBUPROFEN 200 MG PO TABS Oral Take 200 mg  by mouth every 6 (six) hours as needed. For pain relief    . ADULT MULTIVITAMIN W/MINERALS CH Oral Take 1 tablet by mouth daily.    . VENLAFAXINE HCL ER 150 MG PO CP24 Oral Take 300 mg by mouth daily.    Marland Kitchen VITAMIN B-12 1000 MCG PO TABS Oral Take 1,000 mcg by mouth daily.      BP 114/57  Pulse 98  Temp 98 F (36.7 C)  Resp 20  SpO2 95%  Physical Exam  Nursing note and vitals reviewed. Constitutional: She is oriented to person, place, and time. She appears well-developed and well-nourished. No distress.  HENT:  Head: Normocephalic and atraumatic.  Eyes: Conjunctivae and EOM are normal.  Neck: Normal range of motion.  Pulmonary/Chest: Effort normal.  Musculoskeletal:       Right ankle: She exhibits decreased range of motion and swelling. She exhibits no ecchymosis, no laceration and normal pulse. tenderness. Lateral malleolus, medial malleolus and proximal fibula tenderness found. No head of 5th metatarsal tenderness found. Achilles tendon normal. Achilles tendon exhibits no pain.       Feet:  Neurological: She is alert and oriented to person, place, and time.  Skin: Skin is warm and dry. No rash noted. She is not diaphoretic.  Psychiatric: She has a normal mood and affect. Her behavior is normal.    ED Course  Procedures (including critical care time)  Labs Reviewed - No data to display Dg Ankle Complete Right  12/29/2011  *RADIOLOGY REPORT*  Clinical Data: Trauma and pain.  RIGHT ANKLE - COMPLETE 3+ VIEW  Comparison: Foot films same date  Findings: Comminuted oblique fracture of the distal fibula with extension into the lateral ankle mortise.  Transverse displaced fracture of the medial malleolus.  Base of fifth metatarsal and talar dome intact.  IMPRESSION: Oblique fracture of the distal fibula with extension into the ankle mortise.  Transverse fracture of the medial malleolus.  Original Report Authenticated By: Consuello Bossier, M.D.   Dg Foot Complete Right  12/29/2011   *RADIOLOGY REPORT*  Clinical Data: Trauma with ankle pain and swelling.  RIGHT FOOT COMPLETE - 3+ VIEW  Comparison: Ankle films same date  Findings: Ankle fractures will be detailed on dedicated films.  No foot fracture identified.  Tiny calcaneal spur.  IMPRESSION: Ankle fractures, as will be detailed on dedicated films.  No acute findings about the foot.  Original Report Authenticated By: Consuello Bossier, M.D.     1. Ankle fracture       MDM  Oblique fracture of distal fibula and transverse fracture of medial malleolus, unstable joint  Patient's pain treated in the emergency department with Percocet and later Dilaudid.  Patient x-rays reviewed and discussed with Dr. Weldon Inches who spoke with orthopedic on-call Dr. Azucena Cecil who has asked the patient be put in a splint and followup Monday morning.  Patient will be discharged with 30 Percocet.  Patient has been given crutches in the emergency department.  Vital signs stable patient in no acute distress.  Patient verbalized understanding the importance of followup         Jaci Carrel, PA-C 12/29/11 1927  Jaci Carrel, PA-C 01/22/12 1541

## 2011-12-30 NOTE — ED Provider Notes (Addendum)
Medical screening examination/treatment/procedure(s) were conducted as a shared visit with non-physician practitioner(s) and myself.  I personally evaluated the patient during the encounter  Nicholes Stairs, MD 12/30/11 4540  Cheri Guppy, MD 01/25/12 9811  Cheri Guppy, MD 01/25/12 [phone]

## 2011-12-30 NOTE — ED Provider Notes (Signed)
Medical screening examination/treatment/procedure(s) were conducted as a shared visit with non-physician practitioner(s) and myself.  I personally evaluated the patient during the encounter 46 year old, female, complains of right ankle pain after an inversion injury.  She swelling and tenderness around her ankle.  With good pulses.  X-ray shows bimalleolar fracture.  I spoke with the orthopedist.  He said to place a splint and have her followup in the office.  This week.  We explained the findings and plan to the patient and the family.  They understand and agree to  Nicholes Stairs, MD 12/30/11 [phone]

## 2012-01-25 NOTE — ED Provider Notes (Signed)
Medical screening examination/treatment/procedure(s) were conducted as a shared visit with non-physician practitioner(s) and myself.  I personally evaluated the patient during the encounter  Cheri Guppy, MD 01/25/12 [phone]

## 2012-07-04 ENCOUNTER — Other Ambulatory Visit: Payer: Self-pay | Admitting: Gastroenterology

## 2014-03-09 ENCOUNTER — Other Ambulatory Visit: Payer: Self-pay | Admitting: Internal Medicine

## 2014-03-09 ENCOUNTER — Ambulatory Visit
Admission: RE | Admit: 2014-03-09 | Discharge: 2014-03-09 | Disposition: A | Discharge status: Home / Self Care | Payer: BC Managed Care – PPO | Source: Ambulatory Visit | Attending: Internal Medicine | Admitting: Internal Medicine

## 2014-03-09 DIAGNOSIS — R1032 Left lower quadrant pain: Secondary | ICD-10-CM

## 2015-05-27 ENCOUNTER — Other Ambulatory Visit: Payer: Self-pay | Admitting: Internal Medicine

## 2015-05-27 DIAGNOSIS — Z1231 Encounter for screening mammogram for malignant neoplasm of breast: Secondary | ICD-10-CM

## 2015-06-02 ENCOUNTER — Ambulatory Visit
Admission: RE | Admit: 2015-06-02 | Discharge: 2015-06-02 | Disposition: A | Discharge status: Home / Self Care | Payer: 59 | Source: Ambulatory Visit | Attending: Internal Medicine | Admitting: Internal Medicine

## 2015-06-02 DIAGNOSIS — Z1231 Encounter for screening mammogram for malignant neoplasm of breast: Secondary | ICD-10-CM

## 2018-07-30 ENCOUNTER — Other Ambulatory Visit: Payer: Self-pay | Admitting: Specialist

## 2018-07-30 DIAGNOSIS — M5412 Radiculopathy, cervical region: Secondary | ICD-10-CM

## 2018-07-30 DIAGNOSIS — M5416 Radiculopathy, lumbar region: Secondary | ICD-10-CM

## 2018-08-13 ENCOUNTER — Ambulatory Visit: Payer: 59

## 2020-02-01 ENCOUNTER — Other Ambulatory Visit: Payer: Self-pay | Admitting: Family Medicine

## 2020-02-01 ENCOUNTER — Other Ambulatory Visit: Payer: Self-pay | Admitting: Physical Therapy

## 2020-02-01 DIAGNOSIS — Z1231 Encounter for screening mammogram for malignant neoplasm of breast: Secondary | ICD-10-CM

## 2021-05-24 ENCOUNTER — Other Ambulatory Visit: Payer: Self-pay | Admitting: Family Medicine

## 2021-05-24 DIAGNOSIS — Z1231 Encounter for screening mammogram for malignant neoplasm of breast: Secondary | ICD-10-CM

## 2021-05-30 ENCOUNTER — Other Ambulatory Visit: Payer: Self-pay | Admitting: Family Medicine

## 2021-05-30 DIAGNOSIS — Z87891 Personal history of nicotine dependence: Secondary | ICD-10-CM

## 2021-09-11 ENCOUNTER — Other Ambulatory Visit: Payer: Self-pay | Admitting: *Deleted

## 2021-09-11 DIAGNOSIS — Z87891 Personal history of nicotine dependence: Secondary | ICD-10-CM

## 2021-10-09 ENCOUNTER — Encounter: Payer: Self-pay | Admitting: Acute Care

## 2021-10-09 ENCOUNTER — Telehealth (INDEPENDENT_AMBULATORY_CARE_PROVIDER_SITE_OTHER): Payer: 59 | Admitting: Acute Care

## 2021-10-09 ENCOUNTER — Other Ambulatory Visit: Payer: Self-pay

## 2021-10-09 DIAGNOSIS — F17210 Nicotine dependence, cigarettes, uncomplicated: Secondary | ICD-10-CM | POA: Diagnosis not present

## 2021-10-09 NOTE — Patient Instructions (Signed)
Thank you for participating in the [facility]. °It was our pleasure to meet you today. °We will call you with the results of your scan within the next few days. °Your scan will be assigned a Lung RADS category score by the physicians reading the scans.  °This Lung RADS score determines follow up scanning.  °See below for description of categories, and follow up screening recommendations. °We will be in touch to schedule your follow up screening annually or based on recommendations of our providers. °We will fax a copy of your scan results to your Primary Care Physician, or the physician who referred you to the program, to ensure they have the results. °Please call the office if you have any questions or concerns regarding your scanning experience or results.  °Our office number is [phone]. °Please speak with Denise Phelps, RN. She is our Lung Cancer Screening RN. °If she is unavailable when you call, please have the office staff send her a message. She will return your call at her earliest convenience. °Remember, if your scan is normal, we will scan you annually as long as you continue to meet the criteria for the program. (Age 55-77, Current smoker or smoker who has quit within the last 15 years). °If you are a smoker, remember, quitting is the single most powerful action that you can take to decrease your risk of lung cancer and other pulmonary, breathing related problems. °We know quitting is hard, and we are here to help.  °Please let us know if there is anything we can do to help you meet your goal of quitting. °If you are a former smoker, congratulations. We are proud of you! Remain smoke free! °Remember you can refer friends or family members through the number above.  °We will screen them to make sure they meet criteria for the program. °Thank you for helping us take better care of you by participating in Lung Screening. ° °You can receive free nicotine replacement therapy  ( patches, gum or mints) by calling 1-800-QUIT NOW. Please call so we can get you on the path to becoming  a non-smoker. I know it is hard, but you can do this! ° °Lung RADS Categories: ° °Lung RADS 1: no nodules or definitely non-concerning nodules.  °Recommendation is for a repeat annual scan in 12 months. ° °Lung RADS 2:  nodules that are non-concerning in appearance and behavior with a very low likelihood of becoming an active cancer. °Recommendation is for a repeat annual scan in 12 months. ° °Lung RADS 3: nodules that are probably non-concerning , includes nodules with a low likelihood of becoming an active cancer.  Recommendation is for a 6-month repeat screening scan. Often noted after an upper respiratory illness. We will be in touch to make sure you have no questions, and to schedule your 6-month scan. ° °Lung RADS 4 A: nodules with concerning findings, recommendation is most often for a follow up scan in 3 months or additional testing based on our provider's assessment of the scan. We will be in touch to make sure you have no questions and to schedule the recommended 3 month follow up scan. ° °Lung RADS 4 B:  indicates findings that are concerning. We will be in touch with you to schedule additional diagnostic testing based on our provider's  assessment of the scan. ° °Hypnosis for smoking cessation  °[employer] °[phone] ° °Acupuncture for smoking cessation  °[facility] °[phone]  °

## 2021-10-09 NOTE — Progress Notes (Signed)
Shared Decision Making Visit Lung Cancer Screening Program [phone])   Eligibility: Age 55 y.o. Pack Years Smoking History Calculation 41 pack year smoking history (# packs/per year x # years smoked) Recent History of coughing up blood  no Unexplained weight loss? no ( >Than 15 pounds within the last 6 months ) Prior History Lung / other cancer no (Diagnosis within the last 5 years already requiring surveillance chest CT Scans). Smoking Status Current Smoker Former Smokers: Years since quit: NA  Quit Date: NA  Visit Components: Discussion included one or more decision making aids. yes Discussion included risk/benefits of screening. yes Discussion included potential follow up diagnostic testing for abnormal scans. yes Discussion included meaning and risk of over diagnosis. yes Discussion included meaning and risk of False Positives. yes Discussion included meaning of total radiation exposure. yes  Counseling Included: Importance of adherence to annual lung cancer LDCT screening. yes Impact of comorbidities on ability to participate in the program. yes Ability and willingness to under diagnostic treatment. yes  Smoking Cessation Counseling: Current Smokers:  Discussed importance of smoking cessation. yes Information about tobacco cessation classes and interventions provided to patient. yes Patient provided with "ticket" for LDCT Scan. yes Symptomatic Patient. no  Counseling NA Diagnosis Code: Tobacco Use Z72.0 Asymptomatic Patient yes  Counseling (Intermediate counseling: > three minutes counseling) [id number] Former Smokers:  Discussed the importance of maintaining cigarette abstinence. yes Diagnosis Code: Personal History of Nicotine Dependence. N46.270 Information about tobacco cessation classes and interventions provided to patient. Yes Patient provided with "ticket" for LDCT Scan. yes Written Order for Lung Cancer Screening with LDCT placed in Epic. Yes (CT Chest Lung Cancer  Screening Low Dose W/O CM) [id number] Z12.2-Screening of respiratory organs Z87.891-Personal history of nicotine dependence  I have spent 25 minutes of face to face time with  Mr. Moskowitz discussing the risks and benefits of lung cancer screening. We viewed a power point together that explained in detail the above noted topics. We paused at intervals to allow for questions to be asked and answered to ensure understanding.We discussed that the single most powerful action that she  can take to decrease her  risk of developing lung cancer is to quit smoking. We discussed whether or not she  is ready to commit to setting a quit date. We discussed options for tools to aid in quitting smoking including nicotine replacement therapy, non-nicotine medications, support groups, Quit Smart classes, and behavior modification. We discussed that often times setting smaller, more achievable goals, such as eliminating 1 cigarette a day for a week and then 2 cigarettes a day for a week can be helpful in slowly decreasing the number of cigarettes smoked. This allows for a sense of accomplishment as well as providing a clinical benefit. I gave her  the " Be Stronger Than Your Excuses" card with contact information for community resources, classes, free nicotine replacement therapy, and access to mobile apps, text messaging, and on-line smoking cessation help. I have also given her my card and contact information in the event she needs to contact me. We discussed the time and location of the scan, and that either Abigail Miyamoto RN or I will call with the results within 24-48 hours of receiving them. I have offered her  a copy of the power point we viewed  as a resource in the event they need reinforcement of the concepts we discussed today in the office. The patient verbalized understanding of all of  the above and had no further questions  upon leaving the office. They have my contact information in the event they have any further  questions.  I spent 3 minutes counseling on smoking cessation and the health risks of continued tobacco abuse.  I explained to the patient that there has been a high incidence of coronary artery disease noted on these exams. I explained that this is a non-gated exam therefore degree or severity cannot be determined. This patient is not on statin therapy. I have asked the patient to follow-up with their PCP regarding any incidental finding of coronary artery disease and management with diet or medication as their PCP  feels is clinically indicated. The patient verbalized understanding of the above and had no further questions upon completion of the visit.      Bevelyn Ngo, NP 10/09/2021

## 2021-10-10 ENCOUNTER — Ambulatory Visit
Admission: RE | Admit: 2021-10-10 | Discharge: 2021-10-10 | Disposition: A | Discharge status: Home / Self Care | Payer: Managed Care, Other (non HMO) | Source: Ambulatory Visit | Attending: Acute Care | Admitting: Acute Care

## 2021-10-10 ENCOUNTER — Other Ambulatory Visit: Payer: Self-pay

## 2021-10-10 DIAGNOSIS — Z87891 Personal history of nicotine dependence: Secondary | ICD-10-CM | POA: Diagnosis present

## 2021-10-19 ENCOUNTER — Other Ambulatory Visit: Payer: Self-pay | Admitting: Acute Care

## 2021-10-19 DIAGNOSIS — Z87891 Personal history of nicotine dependence: Secondary | ICD-10-CM

## 2021-10-19 DIAGNOSIS — F17210 Nicotine dependence, cigarettes, uncomplicated: Secondary | ICD-10-CM

## 2021-10-20 DIAGNOSIS — L710 Perioral dermatitis: Secondary | ICD-10-CM | POA: Diagnosis not present

## 2021-10-26 DIAGNOSIS — M542 Cervicalgia: Secondary | ICD-10-CM | POA: Diagnosis not present

## 2021-10-26 DIAGNOSIS — G3184 Mild cognitive impairment, so stated: Secondary | ICD-10-CM | POA: Diagnosis not present

## 2021-10-26 DIAGNOSIS — R202 Paresthesia of skin: Secondary | ICD-10-CM | POA: Diagnosis not present

## 2021-10-26 DIAGNOSIS — Z79899 Other long term (current) drug therapy: Secondary | ICD-10-CM | POA: Diagnosis not present

## 2021-10-26 DIAGNOSIS — M545 Low back pain, unspecified: Secondary | ICD-10-CM | POA: Diagnosis not present

## 2021-10-26 DIAGNOSIS — R2689 Other abnormalities of gait and mobility: Secondary | ICD-10-CM | POA: Diagnosis not present

## 2021-11-23 DIAGNOSIS — I700 Atherosclerosis of aorta: Secondary | ICD-10-CM | POA: Diagnosis not present

## 2021-11-23 DIAGNOSIS — J439 Emphysema, unspecified: Secondary | ICD-10-CM | POA: Diagnosis not present

## 2021-11-23 DIAGNOSIS — I10 Essential (primary) hypertension: Secondary | ICD-10-CM | POA: Diagnosis not present

## 2021-11-23 DIAGNOSIS — F331 Major depressive disorder, recurrent, moderate: Secondary | ICD-10-CM | POA: Diagnosis not present

## 2021-11-23 DIAGNOSIS — R002 Palpitations: Secondary | ICD-10-CM | POA: Diagnosis not present

## 2021-11-28 ENCOUNTER — Other Ambulatory Visit: Payer: Self-pay | Admitting: Family Medicine

## 2021-11-28 ENCOUNTER — Ambulatory Visit (INDEPENDENT_AMBULATORY_CARE_PROVIDER_SITE_OTHER): Payer: BC Managed Care – PPO

## 2021-11-28 DIAGNOSIS — R002 Palpitations: Secondary | ICD-10-CM

## 2021-11-28 NOTE — Progress Notes (Unsigned)
Enrolled for [employer] to mail a ZIO XT long term holter monitor to the patients address on file.  

## 2021-12-01 DIAGNOSIS — R002 Palpitations: Secondary | ICD-10-CM

## 2021-12-14 DIAGNOSIS — R002 Palpitations: Secondary | ICD-10-CM | POA: Diagnosis not present

## 2022-01-04 DIAGNOSIS — G3184 Mild cognitive impairment, so stated: Secondary | ICD-10-CM | POA: Diagnosis not present

## 2022-01-04 DIAGNOSIS — M5442 Lumbago with sciatica, left side: Secondary | ICD-10-CM | POA: Diagnosis not present

## 2022-01-04 DIAGNOSIS — Z79899 Other long term (current) drug therapy: Secondary | ICD-10-CM | POA: Diagnosis not present

## 2022-01-04 DIAGNOSIS — M542 Cervicalgia: Secondary | ICD-10-CM | POA: Diagnosis not present

## 2022-01-04 DIAGNOSIS — R519 Headache, unspecified: Secondary | ICD-10-CM | POA: Diagnosis not present

## 2022-01-04 DIAGNOSIS — G932 Benign intracranial hypertension: Secondary | ICD-10-CM | POA: Diagnosis not present

## 2022-01-04 DIAGNOSIS — M5441 Lumbago with sciatica, right side: Secondary | ICD-10-CM | POA: Diagnosis not present

## 2022-02-06 DIAGNOSIS — M19031 Primary osteoarthritis, right wrist: Secondary | ICD-10-CM | POA: Diagnosis not present

## 2022-02-06 DIAGNOSIS — M1811 Unilateral primary osteoarthritis of first carpometacarpal joint, right hand: Secondary | ICD-10-CM | POA: Diagnosis not present

## 2022-03-06 DIAGNOSIS — M1811 Unilateral primary osteoarthritis of first carpometacarpal joint, right hand: Secondary | ICD-10-CM | POA: Diagnosis not present

## 2022-03-06 DIAGNOSIS — M19031 Primary osteoarthritis, right wrist: Secondary | ICD-10-CM | POA: Diagnosis not present

## 2022-03-08 DIAGNOSIS — G603 Idiopathic progressive neuropathy: Secondary | ICD-10-CM | POA: Diagnosis not present

## 2022-03-08 DIAGNOSIS — M5417 Radiculopathy, lumbosacral region: Secondary | ICD-10-CM | POA: Diagnosis not present

## 2022-03-08 DIAGNOSIS — M5412 Radiculopathy, cervical region: Secondary | ICD-10-CM | POA: Diagnosis not present

## 2022-03-14 DIAGNOSIS — M542 Cervicalgia: Secondary | ICD-10-CM | POA: Diagnosis not present

## 2022-03-14 DIAGNOSIS — M545 Low back pain, unspecified: Secondary | ICD-10-CM | POA: Diagnosis not present

## 2022-03-14 DIAGNOSIS — Z79899 Other long term (current) drug therapy: Secondary | ICD-10-CM | POA: Diagnosis not present

## 2022-03-14 DIAGNOSIS — G932 Benign intracranial hypertension: Secondary | ICD-10-CM | POA: Diagnosis not present

## 2022-03-14 DIAGNOSIS — G3184 Mild cognitive impairment, so stated: Secondary | ICD-10-CM | POA: Diagnosis not present

## 2022-03-28 DIAGNOSIS — F331 Major depressive disorder, recurrent, moderate: Secondary | ICD-10-CM | POA: Diagnosis not present

## 2022-04-04 ENCOUNTER — Encounter: Payer: Self-pay | Admitting: *Deleted

## 2022-04-04 ENCOUNTER — Other Ambulatory Visit: Payer: Self-pay

## 2022-04-04 ENCOUNTER — Emergency Department: Payer: BC Managed Care – PPO

## 2022-04-04 ENCOUNTER — Emergency Department
Admission: EM | Admit: 2022-04-04 | Discharge: 2022-04-04 | Disposition: A | Discharge status: Home / Self Care | Payer: BC Managed Care – PPO | Attending: Emergency Medicine | Admitting: Emergency Medicine

## 2022-04-04 DIAGNOSIS — S0191XA Laceration without foreign body of unspecified part of head, initial encounter: Secondary | ICD-10-CM | POA: Diagnosis not present

## 2022-04-04 DIAGNOSIS — S0101XA Laceration without foreign body of scalp, initial encounter: Secondary | ICD-10-CM | POA: Diagnosis not present

## 2022-04-04 DIAGNOSIS — W19XXXA Unspecified fall, initial encounter: Secondary | ICD-10-CM | POA: Diagnosis not present

## 2022-04-04 DIAGNOSIS — S1191XA Laceration without foreign body of unspecified part of neck, initial encounter: Secondary | ICD-10-CM | POA: Diagnosis not present

## 2022-04-04 DIAGNOSIS — Y92009 Unspecified place in unspecified non-institutional (private) residence as the place of occurrence of the external cause: Secondary | ICD-10-CM | POA: Insufficient documentation

## 2022-04-04 DIAGNOSIS — S0990XA Unspecified injury of head, initial encounter: Secondary | ICD-10-CM | POA: Diagnosis not present

## 2022-04-04 MED ORDER — LIDOCAINE HCL 1 % IJ SOLN
10.0000 mL | Freq: Once | INTRAMUSCULAR | Status: AC
  Administered 2022-04-04: 10 mL
  Filled 2022-04-04: qty 10

## 2022-04-04 MED ORDER — CEPHALEXIN 500 MG PO CAPS: 500.0000 mg | ORAL_CAPSULE | Freq: Four times a day (QID) | ORAL | 0 refills | Status: DC

## 2022-04-04 MED ORDER — CEPHALEXIN 500 MG PO CAPS: 500.0000 mg | ORAL_CAPSULE | Freq: Four times a day (QID) | ORAL | 0 refills | Status: AC

## 2022-04-04 NOTE — ED Triage Notes (Addendum)
Pt is intoxicated and fell backwards and struck a book shelf.  Pt has a laceration to back of head.   Bleeding controlled.  Family with pt.   Pt denies neck or back pain  no loc.

## 2022-04-04 NOTE — Discharge Instructions (Signed)
Take Keflex four times daily for the next seven days. Have staples removed in one week.

## 2022-04-04 NOTE — ED Provider Notes (Signed)
[facility] Provider Note  Patient Contact: 11:09 PM (approximate)   History   Laceration   HPI  [name] is a 56 y.o. female presents to the emergency department with a 7 cm occipital scalp laceration after patient had a mechanical fall in her home.  Patient reports that she is under the influence of alcohol.  No numbness or tingling in the upper and lower extremities.  No chest pain or abdominal pain.      Physical Exam   Triage Vital Signs: ED Triage Vitals  Enc Vitals Group     BP 04/04/22 2137 (!) 145/79     Pulse Rate 04/04/22 2137 79     Resp 04/04/22 2137 17     Temp 04/04/22 2137 98.8 F (37.1 C)     Temp Source 04/04/22 2137 Oral     SpO2 04/04/22 2137 94 %     Weight 04/04/22 2136 198 lb (89.8 kg)     Height 04/04/22 2136 5\' 4"  (1.626 m)     Head Circumference --      Peak Flow --      Pain Score 04/04/22 2135 2     Pain Loc --      Pain Edu? --      Excl. in GC? --     Most recent vital signs: Vitals:   04/04/22 2304 04/04/22 2304  BP: 136/65   Pulse: 87   Resp:  16  Temp:    SpO2:       General: Alert and in no acute distress. Eyes:  PERRL. EOMI. Head: No acute traumatic findings.  Patient has a 7 cm occipital scalp laceration. ENT:      Ears: Tms are pearly.       Nose: No congestion/rhinnorhea.      Mouth/Throat: Mucous membranes are moist. Neck: No stridor. No cervical spine tenderness to palpation. Cardiovascular:  Good peripheral perfusion Respiratory: Normal respiratory effort without tachypnea or retractions. Lungs CTAB. Good air entry to the bases with no decreased or absent breath sounds. Gastrointestinal: Bowel sounds 4 quadrants. Soft and nontender to palpation. No guarding or rigidity. No palpable masses. No distention. No CVA tenderness. Musculoskeletal: Full range of motion to all extremities.  Neurologic:  No gross focal neurologic deficits are appreciated.  Skin:   No rash  noted Other:   ED Results / Procedures / Treatments   Labs (all labs ordered are listed, but only abnormal results are displayed) Labs Reviewed - No data to display    PROCEDURES:  Critical Care performed: No  ..Laceration Repair  Date/Time: 04/04/2022 11:49 PM Performed by: 04/06/2022, PA-C Authorized by: Orvil Feil, PA-C   Consent:    Consent obtained:  Verbal   Risks discussed:  Infection and pain Universal protocol:    Procedure explained and questions answered to patient or proxy's satisfaction: yes     Patient identity confirmed:  Verbally with patient Anesthesia:    Anesthesia method:  Local infiltration   Local anesthetic:  Lidocaine 1% w/o epi Laceration details:    Location:  Scalp   Length (cm):  6   Depth (mm):  5 Pre-procedure details:    Preparation:  Patient was prepped and draped in usual sterile fashion Exploration:    Contaminated: no   Treatment:    Area cleansed with:  Povidone-iodine   Irrigation volume:  200   Debridement:  None Skin repair:    Repair method:  Staples  Number of staples:  7 Approximation:    Approximation:  Close Repair type:    Repair type:  Simple Post-procedure details:    Dressing:  Non-adherent dressing   MEDICATIONS ORDERED IN ED: Medications  lidocaine (XYLOCAINE) 1 % (with pres) injection 10 mL (10 mLs Infiltration Given by Other 04/04/22 2237)     IMPRESSION / MDM / ASSESSMENT AND PLAN / ED COURSE  I reviewed the triage vital signs and the nursing notes.                              Assessment and plan Scalp laceration 56 year old female presents to the emergency department with a 7 cm occipital scalp laceration repaired in the emergency department without complication.  I personally interpreted CTs of the head and cervical spine and there was no evidence of intracranial bleed, skull fracture or C-spine fracture.      FINAL CLINICAL IMPRESSION(S) / ED DIAGNOSES   Final diagnoses:   Laceration of scalp, initial encounter     Rx / DC Orders   ED Discharge Orders          Ordered    cephALEXin (KEFLEX) 500 MG capsule  4 times daily,   Status:  Discontinued        04/04/22 2253    cephALEXin (KEFLEX) 500 MG capsule  4 times daily        04/04/22 2303             Note:  This document was prepared using Dragon voice recognition software and may include unintentional dictation errors.   Pia Mau Dilley, PA-C 04/04/22 2351    Merwyn Katos, MD 04/08/22 [phone]

## 2022-04-04 NOTE — ED Notes (Signed)
See triage note. Pt also has skin tears to legs from her dog where she states she has "thin skin"; dried blood noted at sites; pt's son at bedside; pt sleepy but wakes to inside-voice. Pt's son is her ride home.

## 2022-04-12 DIAGNOSIS — Z4802 Encounter for removal of sutures: Secondary | ICD-10-CM | POA: Diagnosis not present

## 2022-04-12 DIAGNOSIS — M545 Low back pain, unspecified: Secondary | ICD-10-CM | POA: Diagnosis not present

## 2022-04-17 DIAGNOSIS — M47816 Spondylosis without myelopathy or radiculopathy, lumbar region: Secondary | ICD-10-CM | POA: Diagnosis not present

## 2022-04-17 DIAGNOSIS — M533 Sacrococcygeal disorders, not elsewhere classified: Secondary | ICD-10-CM | POA: Diagnosis not present

## 2022-04-17 DIAGNOSIS — M47896 Other spondylosis, lumbar region: Secondary | ICD-10-CM | POA: Diagnosis not present

## 2022-04-17 DIAGNOSIS — M545 Low back pain, unspecified: Secondary | ICD-10-CM | POA: Diagnosis not present

## 2022-04-19 DIAGNOSIS — S81802S Unspecified open wound, left lower leg, sequela: Secondary | ICD-10-CM | POA: Diagnosis not present

## 2022-04-19 DIAGNOSIS — M545 Low back pain, unspecified: Secondary | ICD-10-CM | POA: Diagnosis not present

## 2022-04-19 DIAGNOSIS — R600 Localized edema: Secondary | ICD-10-CM | POA: Diagnosis not present

## 2022-07-03 DIAGNOSIS — G3184 Mild cognitive impairment, so stated: Secondary | ICD-10-CM | POA: Diagnosis not present

## 2022-07-03 DIAGNOSIS — M545 Low back pain, unspecified: Secondary | ICD-10-CM | POA: Diagnosis not present

## 2022-07-03 DIAGNOSIS — M542 Cervicalgia: Secondary | ICD-10-CM | POA: Diagnosis not present

## 2022-07-03 DIAGNOSIS — G932 Benign intracranial hypertension: Secondary | ICD-10-CM | POA: Diagnosis not present

## 2022-07-03 DIAGNOSIS — Z79899 Other long term (current) drug therapy: Secondary | ICD-10-CM | POA: Diagnosis not present

## 2022-07-09 DIAGNOSIS — I10 Essential (primary) hypertension: Secondary | ICD-10-CM | POA: Diagnosis not present

## 2022-07-09 DIAGNOSIS — I700 Atherosclerosis of aorta: Secondary | ICD-10-CM | POA: Diagnosis not present

## 2022-07-09 DIAGNOSIS — R9431 Abnormal electrocardiogram [ECG] [EKG]: Secondary | ICD-10-CM | POA: Diagnosis not present

## 2022-10-10 ENCOUNTER — Ambulatory Visit
Admission: RE | Admit: 2022-10-10 | Discharge: 2022-10-10 | Disposition: A | Discharge status: Home / Self Care | Payer: BC Managed Care – PPO | Source: Ambulatory Visit | Attending: Acute Care | Admitting: Acute Care

## 2022-10-10 DIAGNOSIS — I700 Atherosclerosis of aorta: Secondary | ICD-10-CM | POA: Insufficient documentation

## 2022-10-10 DIAGNOSIS — J439 Emphysema, unspecified: Secondary | ICD-10-CM | POA: Diagnosis not present

## 2022-10-10 DIAGNOSIS — I2510 Atherosclerotic heart disease of native coronary artery without angina pectoris: Secondary | ICD-10-CM | POA: Insufficient documentation

## 2022-10-10 DIAGNOSIS — F17210 Nicotine dependence, cigarettes, uncomplicated: Secondary | ICD-10-CM | POA: Diagnosis not present

## 2022-10-10 DIAGNOSIS — Z122 Encounter for screening for malignant neoplasm of respiratory organs: Secondary | ICD-10-CM | POA: Insufficient documentation

## 2022-10-10 DIAGNOSIS — Z87891 Personal history of nicotine dependence: Secondary | ICD-10-CM

## 2022-10-11 DIAGNOSIS — M5412 Radiculopathy, cervical region: Secondary | ICD-10-CM | POA: Diagnosis not present

## 2022-10-11 DIAGNOSIS — M5459 Other low back pain: Secondary | ICD-10-CM | POA: Diagnosis not present

## 2022-10-11 DIAGNOSIS — Z79899 Other long term (current) drug therapy: Secondary | ICD-10-CM | POA: Diagnosis not present

## 2022-10-11 DIAGNOSIS — R519 Headache, unspecified: Secondary | ICD-10-CM | POA: Diagnosis not present

## 2022-10-12 ENCOUNTER — Other Ambulatory Visit: Payer: Self-pay | Admitting: Acute Care

## 2022-10-12 DIAGNOSIS — F17210 Nicotine dependence, cigarettes, uncomplicated: Secondary | ICD-10-CM

## 2022-10-12 DIAGNOSIS — Z122 Encounter for screening for malignant neoplasm of respiratory organs: Secondary | ICD-10-CM

## 2022-10-12 DIAGNOSIS — Z87891 Personal history of nicotine dependence: Secondary | ICD-10-CM

## 2022-10-26 ENCOUNTER — Emergency Department (HOSPITAL_BASED_OUTPATIENT_CLINIC_OR_DEPARTMENT_OTHER): Payer: BC Managed Care – PPO

## 2022-10-26 ENCOUNTER — Emergency Department (HOSPITAL_BASED_OUTPATIENT_CLINIC_OR_DEPARTMENT_OTHER)
Admission: EM | Admit: 2022-10-26 | Discharge: 2022-10-26 | Disposition: A | Discharge status: Home / Self Care | Payer: BC Managed Care – PPO | Attending: Emergency Medicine | Admitting: Emergency Medicine

## 2022-10-26 ENCOUNTER — Other Ambulatory Visit: Payer: Self-pay

## 2022-10-26 DIAGNOSIS — W1830XA Fall on same level, unspecified, initial encounter: Secondary | ICD-10-CM | POA: Diagnosis not present

## 2022-10-26 DIAGNOSIS — Y9301 Activity, walking, marching and hiking: Secondary | ICD-10-CM | POA: Diagnosis not present

## 2022-10-26 DIAGNOSIS — S0012XA Contusion of left eyelid and periocular area, initial encounter: Secondary | ICD-10-CM | POA: Insufficient documentation

## 2022-10-26 DIAGNOSIS — W19XXXA Unspecified fall, initial encounter: Secondary | ICD-10-CM

## 2022-10-26 DIAGNOSIS — Z043 Encounter for examination and observation following other accident: Secondary | ICD-10-CM | POA: Diagnosis not present

## 2022-10-26 DIAGNOSIS — M25512 Pain in left shoulder: Secondary | ICD-10-CM | POA: Insufficient documentation

## 2022-10-26 DIAGNOSIS — S51012A Laceration without foreign body of left elbow, initial encounter: Secondary | ICD-10-CM | POA: Diagnosis not present

## 2022-10-26 DIAGNOSIS — S50312A Abrasion of left elbow, initial encounter: Secondary | ICD-10-CM | POA: Diagnosis not present

## 2022-10-26 DIAGNOSIS — S8002XA Contusion of left knee, initial encounter: Secondary | ICD-10-CM | POA: Insufficient documentation

## 2022-10-26 DIAGNOSIS — M47812 Spondylosis without myelopathy or radiculopathy, cervical region: Secondary | ICD-10-CM | POA: Diagnosis not present

## 2022-10-26 MED ORDER — OXYCODONE-ACETAMINOPHEN 5-325 MG PO TABS
1.0000 | ORAL_TABLET | Freq: Once | ORAL | Status: AC
  Administered 2022-10-26: 1 via ORAL
  Filled 2022-10-26: qty 1

## 2022-10-26 NOTE — ED Notes (Signed)
Pt.  [name] today at [employer].  Pt. Injured her R upper brow area and her L arm and the L knee.    Noted abrasion to the [place] area with bruising and abrasion with dried blood.  Pt. Also has noted skin tear to the L top arm in 2 places with controlled bleeding and skin rollded back.  The L knee noted with a small purple colored area.

## 2022-10-26 NOTE — ED Notes (Signed)
ED Provider at bedside. 

## 2022-10-26 NOTE — ED Triage Notes (Signed)
Pt POV c/o mechanical fall appx 12 today. Denies LOC, denies blood thinners.   Pt reports new onset numbness starting when she was en route to ED. Pt speaking in full sentences, respirations equal and unlabored.   C/o left eye pain, skin tear to left arm, left knee bruising.  Bleeding controlled at time of triage. Denies any confusion.

## 2022-10-26 NOTE — ED Provider Notes (Signed)
[facility] EMERGENCY DEPARTMENT Provider Note   CSN: [id number] Arrival date & time: 10/26/22  1341     History  Chief Complaint  Patient presents with   [name] is a 56 y.o. female  who presents to the Emergency Department complaining of a mechanical fall onset PTA. Pt notes that she fell while walking from the store today. Denies LOC. No meds tried PTA. Was informed to come into the ED for her symptoms from an UC in [place]. Pt has associated abrasion to left elbow, bruise to left knee with pain, left upper eyelid abrasion/bruise. She denies LOC, vomiting, neck pain, back pain.  The history is provided by the patient. No language interpreter was used.       Home Medications Prior to Admission medications   Medication Sig Start Date End Date Taking? Authorizing Provider  esomeprazole (NEXIUM) 40 MG capsule Take 40 mg by mouth daily before breakfast.    [provider]  gabapentin (NEURONTIN) 600 MG tablet Take 600 mg by mouth daily.    [provider]  ibuprofen (ADVIL,MOTRIN) 200 MG tablet Take 200 mg by mouth every 6 (six) hours as needed. For pain relief    [provider]  Multiple Vitamin (MULITIVITAMIN WITH MINERALS) TABS Take 1 tablet by mouth daily.    [provider]  venlafaxine (EFFEXOR-XR) 150 MG 24 hr capsule Take 300 mg by mouth daily.    [provider]  vitamin B-12 (CYANOCOBALAMIN) 1000 MCG tablet Take 1,000 mcg by mouth daily.    [provider]      Allergies    Patient has no known allergies.    Review of Systems   Review of Systems  All other systems reviewed and are negative.   Physical Exam Updated Vital Signs BP (!) 145/84 (BP Location: Right Arm)   Pulse 83   Temp 97.6 F (36.4 C)   Resp 20   Ht 5\' 4"  (1.626 m)   Wt 80.3 kg   SpO2 95%   BMI 30.38 kg/m  Physical Exam Vitals and nursing note reviewed.  Constitutional:      General: She is not in acute  distress.    Appearance: She is not diaphoretic.  HENT:     Head: Normocephalic. Abrasion present.     Comments: Ecchymosis noted to left upper eyelid with abrasion noted to left eyebrow with mild tenderness to palpation noted to the area.     Mouth/Throat:     Pharynx: No oropharyngeal exudate.  Eyes:     General: No scleral icterus.    Conjunctiva/sclera: Conjunctivae normal.  Cardiovascular:     Rate and Rhythm: Normal rate and regular rhythm.     Pulses: Normal pulses.     Heart sounds: Normal heart sounds.  Pulmonary:     Effort: Pulmonary effort is normal. No respiratory distress.     Breath sounds: Normal breath sounds. No wheezing.  Abdominal:     General: Bowel sounds are normal.     Palpations: Abdomen is soft. There is no mass.     Tenderness: There is no abdominal tenderness. There is no guarding or rebound.  Musculoskeletal:        General: Normal range of motion.     Cervical back: Normal range of motion and neck supple.     Comments: No spinal tenderness to palpation.  No tenderness to palpation noted to musculature of back.  Skin tear noted to lateral aspect  of left elbow with full pronation and supination of the left elbow without difficulty.  Mild tenderness to palpation noted to anterior left shoulder without overlying skin change.  Full active range of motion of left shoulder.  Grip strength 5/5 bilaterally.  Strength sensation intact bilateral upper and lower extremities.  Bruising noted to the inferior aspect of the left patella.  Patient able to flex and extend left knee without difficulty against resistance.  Skin:    General: Skin is warm and dry.  Neurological:     Mental Status: She is alert.  Psychiatric:        Behavior: Behavior normal.     ED Results / Procedures / Treatments   Labs (all labs ordered are listed, but only abnormal results are displayed) Labs Reviewed - No data to display  EKG None  Radiology CT Cervical Spine Wo  Contrast  Result Date: 10/26/2022 CLINICAL DATA:  Blunt facial trauma.  Fall today. EXAM: CT CERVICAL SPINE WITHOUT CONTRAST TECHNIQUE: Multidetector CT imaging of the cervical spine was performed without intravenous contrast. Multiplanar CT image reconstructions were also generated. RADIATION DOSE REDUCTION: This exam was performed according to the departmental dose-optimization program which includes automated exposure control, adjustment of the mA and/or kV according to patient size and/or use of iterative reconstruction technique. COMPARISON:  CT cervical spine 04/04/2022. FINDINGS: Alignment: Convex right cervicothoracic scoliosis with mild straightening and a stable mild anterolisthesis at the C2-3 and C4-5 levels. The alignment appears unchanged from previous study. Skull base and vertebrae: No evidence of acute fracture or traumatic subluxation. Soft tissues and spinal canal: No prevertebral fluid or swelling. No visible canal hematoma. Disc levels: Unchanged multilevel spondylosis with disc space narrowing, uncinate spurring and facet hypertrophy. Resulting multilevel foraminal narrowing which appears worst on the left at C4-5 and C5-6 and on the right at C6-7. No large disc herniation identified. Upper chest: Clear lung apices. Other: Stable chronic right mastoid effusion. IMPRESSION: 1. No evidence of acute cervical spine fracture, traumatic subluxation or static signs of instability. 2. Stable multilevel cervical spondylosis with associated scoliosis. Electronically Signed   By: Richardean Sale M.D.   On: 10/26/2022 14:31   CT Head Wo Contrast  Result Date: 10/26/2022 CLINICAL DATA:  Blunt facial trauma.  Mechanical fall 12 dose ago. EXAM: CT HEAD WITHOUT CONTRAST TECHNIQUE: Contiguous axial images were obtained from the base of the skull through the vertex without intravenous contrast. RADIATION DOSE REDUCTION: This exam was performed according to the departmental dose-optimization program which  includes automated exposure control, adjustment of the mA and/or kV according to patient size and/or use of iterative reconstruction technique. COMPARISON:  CT examination dated Apr 04, 2022 FINDINGS: Brain: No evidence of acute infarction, hemorrhage, hydrocephalus, extra-axial collection or mass lesion/mass effect. Empty sella, unchanged. Vascular: No hyperdense vessel or unexpected calcification. Skull: Normal. Negative for fracture or focal lesion. Sinuses/Orbits: No acute finding. Other: None. IMPRESSION: 1. No acute intracranial abnormality. 2. Empty sella, unchanged. Electronically Signed   By: Keane Police D.O.   On: 10/26/2022 14:30    Procedures Procedures    Medications Ordered in ED Medications  oxyCODONE-acetaminophen (PERCOCET/ROXICET) 5-325 MG per tablet 1 tablet (1 tablet Oral Given 10/26/22 1518)    ED Course/ Medical Decision Making/ A&P Clinical Course as of 10/26/22 1741  Fri Oct 26, 2022  1510 Discussed with patient and family regarding CT findings.  Offered patient x-ray of left shoulder and left knee to which patient declines at this time.  Discussed with  patient discharge treatment plan.  Answered all available questions.  Patient appears safe for discharge at this time. [SB]    Clinical Course User Index [SB] Akshita Italiano A, PA-C                           Medical Decision Making Amount and/or Complexity of Data Reviewed Radiology: ordered.  Risk Prescription drug management.   Pt with mechanical fall occurring prior to arrival. Denies hitting their head, LOC, headache, vision changes. Vital signs patient afebrile. On exam, patient with Ecchymosis noted to left upper eyelid with abrasion noted to left eyebrow with mild tenderness to palpation noted to the area. No spinal tenderness to palpation.  No tenderness to palpation noted to musculature of back.  Skin tear noted to lateral aspect of left elbow with full pronation and supination of the left elbow without  difficulty.  Mild tenderness to palpation noted to anterior left shoulder without overlying skin change.  Full active range of motion of left shoulder.  Grip strength 5/5 bilaterally.  Strength sensation intact bilateral upper and lower extremities.  Bruising noted to the inferior aspect of the left patella.  Patient able to flex and extend left knee without difficulty against resistance.  No acute cardiovascular respiratory exam findings.  Differential diagnosis includes fracture, dislocation, abrasion, contusion.  Additional history obtained:  Additional history obtained from family at bedside  Imaging: I ordered imaging studies including CT head without CT cervical spine I independently visualized and interpreted imaging which showed: No acute findings I agree with the radiologist interpretation  Medications:  I ordered medication including Percocet for pain management Reevaluation of the patient after these medicines and interventions, I reevaluated the patient and found that they have improved I have reviewed the patients home medicines and have made adjustments as needed    Disposition: Presenting suspicious for likely mechanical fall.  Doubt at this time fracture, dislocation, herniation. After consideration of the diagnostic results and the patients response to treatment, I feel that the patient would benefit from Discharge home.  Supportive care measures and strict return precautions discussed with patient at bedside. Appears safe for discharge at this time. Follow up as indicated in discharge paperwork.   This chart was dictated using voice recognition software, Dragon. Despite the best efforts of this provider to proofread and correct errors, errors may still occur which can change documentation meaning.   Final Clinical Impression(s) / ED Diagnoses Final diagnoses:  Fall, initial encounter  Skin tear of elbow without complication, left, initial encounter    Rx / DC Orders ED  Discharge Orders     None         Jahmil Macleod A, PA-C 10/26/22 1741    Virgina Norfolk, DO 10/26/22 2216

## 2022-10-26 NOTE — ED Notes (Signed)
Dressing Applied to arm wounds as ordered by PAC wet to dry x 2.  Pt. Tolerated well.

## 2022-10-26 NOTE — Discharge Instructions (Signed)
It was a pleasure taking care of you today!  Your CT scans did not show any emergent findings today.  You may take over-the-counter 500 mg Tylenol every 6 hours and alternate with 600 mg ibuprofen every 6 hours as needed for pain for no more than 7 days.  You may place ice to the affected area up to 15 minutes at a time, sure to place a barrier between your skin and ice.  You may follow-up with your primary care provider regarding today's ED visit.  Return to the emergency department if you experience increasing/worsening symptoms.

## 2022-12-20 DIAGNOSIS — M5459 Other low back pain: Secondary | ICD-10-CM | POA: Diagnosis not present

## 2022-12-20 DIAGNOSIS — R519 Headache, unspecified: Secondary | ICD-10-CM | POA: Diagnosis not present

## 2023-01-24 DIAGNOSIS — G603 Idiopathic progressive neuropathy: Secondary | ICD-10-CM | POA: Diagnosis not present

## 2023-02-14 DIAGNOSIS — M7918 Myalgia, other site: Secondary | ICD-10-CM | POA: Diagnosis not present

## 2023-03-25 DIAGNOSIS — M5442 Lumbago with sciatica, left side: Secondary | ICD-10-CM | POA: Diagnosis not present

## 2023-04-10 ENCOUNTER — Other Ambulatory Visit: Payer: Self-pay | Admitting: Family Medicine

## 2023-04-10 DIAGNOSIS — M545 Low back pain, unspecified: Secondary | ICD-10-CM

## 2023-04-27 ENCOUNTER — Ambulatory Visit
Admission: RE | Admit: 2023-04-27 | Discharge: 2023-04-27 | Disposition: A | Discharge status: Home / Self Care | Payer: BC Managed Care – PPO | Source: Ambulatory Visit | Attending: Family Medicine | Admitting: Family Medicine

## 2023-04-27 DIAGNOSIS — M25552 Pain in left hip: Secondary | ICD-10-CM | POA: Diagnosis not present

## 2023-04-27 DIAGNOSIS — M545 Low back pain, unspecified: Secondary | ICD-10-CM | POA: Diagnosis not present

## 2023-04-27 DIAGNOSIS — M79659 Pain in unspecified thigh: Secondary | ICD-10-CM | POA: Diagnosis not present

## 2023-04-27 DIAGNOSIS — M5126 Other intervertebral disc displacement, lumbar region: Secondary | ICD-10-CM | POA: Diagnosis not present

## 2023-05-08 DIAGNOSIS — M7918 Myalgia, other site: Secondary | ICD-10-CM | POA: Diagnosis not present

## 2023-05-23 DIAGNOSIS — M5417 Radiculopathy, lumbosacral region: Secondary | ICD-10-CM | POA: Diagnosis not present

## 2023-05-23 DIAGNOSIS — Z79899 Other long term (current) drug therapy: Secondary | ICD-10-CM | POA: Diagnosis not present

## 2023-06-03 DIAGNOSIS — M5116 Intervertebral disc disorders with radiculopathy, lumbar region: Secondary | ICD-10-CM | POA: Diagnosis not present

## 2023-06-14 DIAGNOSIS — I700 Atherosclerosis of aorta: Secondary | ICD-10-CM | POA: Diagnosis not present

## 2023-06-14 DIAGNOSIS — R634 Abnormal weight loss: Secondary | ICD-10-CM | POA: Diagnosis not present

## 2023-06-14 DIAGNOSIS — E7800 Pure hypercholesterolemia, unspecified: Secondary | ICD-10-CM | POA: Diagnosis not present

## 2023-06-14 DIAGNOSIS — J439 Emphysema, unspecified: Secondary | ICD-10-CM | POA: Diagnosis not present

## 2023-06-14 DIAGNOSIS — K529 Noninfective gastroenteritis and colitis, unspecified: Secondary | ICD-10-CM | POA: Diagnosis not present

## 2023-06-14 DIAGNOSIS — F331 Major depressive disorder, recurrent, moderate: Secondary | ICD-10-CM | POA: Diagnosis not present

## 2023-06-17 ENCOUNTER — Other Ambulatory Visit: Payer: Self-pay | Admitting: Family Medicine

## 2023-06-17 ENCOUNTER — Encounter: Payer: Self-pay | Admitting: Family Medicine

## 2023-06-17 DIAGNOSIS — Z1231 Encounter for screening mammogram for malignant neoplasm of breast: Secondary | ICD-10-CM

## 2023-06-21 DIAGNOSIS — G894 Chronic pain syndrome: Secondary | ICD-10-CM | POA: Diagnosis not present

## 2023-07-25 DIAGNOSIS — M5417 Radiculopathy, lumbosacral region: Secondary | ICD-10-CM | POA: Diagnosis not present

## 2023-07-25 DIAGNOSIS — G5603 Carpal tunnel syndrome, bilateral upper limbs: Secondary | ICD-10-CM | POA: Diagnosis not present

## 2023-08-27 ENCOUNTER — Other Ambulatory Visit: Payer: Self-pay | Admitting: Acute Care

## 2023-08-27 DIAGNOSIS — F17210 Nicotine dependence, cigarettes, uncomplicated: Secondary | ICD-10-CM

## 2023-08-27 DIAGNOSIS — Z87891 Personal history of nicotine dependence: Secondary | ICD-10-CM

## 2023-08-27 DIAGNOSIS — Z122 Encounter for screening for malignant neoplasm of respiratory organs: Secondary | ICD-10-CM

## 2023-09-03 DIAGNOSIS — M5116 Intervertebral disc disorders with radiculopathy, lumbar region: Secondary | ICD-10-CM | POA: Diagnosis not present

## 2023-09-10 DIAGNOSIS — G894 Chronic pain syndrome: Secondary | ICD-10-CM | POA: Diagnosis not present

## 2023-09-26 DIAGNOSIS — Z79899 Other long term (current) drug therapy: Secondary | ICD-10-CM | POA: Diagnosis not present

## 2023-09-26 DIAGNOSIS — M5417 Radiculopathy, lumbosacral region: Secondary | ICD-10-CM | POA: Diagnosis not present

## 2023-10-08 DIAGNOSIS — M5416 Radiculopathy, lumbar region: Secondary | ICD-10-CM | POA: Diagnosis not present

## 2023-10-08 DIAGNOSIS — G894 Chronic pain syndrome: Secondary | ICD-10-CM | POA: Diagnosis not present

## 2023-10-08 DIAGNOSIS — E669 Obesity, unspecified: Secondary | ICD-10-CM | POA: Diagnosis not present

## 2023-10-08 DIAGNOSIS — Z6833 Body mass index (BMI) 33.0-33.9, adult: Secondary | ICD-10-CM | POA: Diagnosis not present

## 2023-10-08 DIAGNOSIS — F17210 Nicotine dependence, cigarettes, uncomplicated: Secondary | ICD-10-CM | POA: Diagnosis not present

## 2023-10-08 DIAGNOSIS — J449 Chronic obstructive pulmonary disease, unspecified: Secondary | ICD-10-CM | POA: Diagnosis not present

## 2023-10-08 DIAGNOSIS — Z79899 Other long term (current) drug therapy: Secondary | ICD-10-CM | POA: Diagnosis not present

## 2023-10-08 DIAGNOSIS — Z01812 Encounter for preprocedural laboratory examination: Secondary | ICD-10-CM | POA: Diagnosis not present

## 2023-10-14 DIAGNOSIS — J449 Chronic obstructive pulmonary disease, unspecified: Secondary | ICD-10-CM | POA: Diagnosis not present

## 2023-10-14 DIAGNOSIS — F17210 Nicotine dependence, cigarettes, uncomplicated: Secondary | ICD-10-CM | POA: Diagnosis not present

## 2023-10-14 DIAGNOSIS — M4316 Spondylolisthesis, lumbar region: Secondary | ICD-10-CM | POA: Diagnosis not present

## 2023-10-14 DIAGNOSIS — M532X6 Spinal instabilities, lumbar region: Secondary | ICD-10-CM | POA: Diagnosis not present

## 2023-10-14 DIAGNOSIS — Z888 Allergy status to other drugs, medicaments and biological substances status: Secondary | ICD-10-CM | POA: Diagnosis not present

## 2023-10-14 DIAGNOSIS — Z6832 Body mass index (BMI) 32.0-32.9, adult: Secondary | ICD-10-CM | POA: Diagnosis not present

## 2023-10-14 DIAGNOSIS — E669 Obesity, unspecified: Secondary | ICD-10-CM | POA: Diagnosis not present

## 2023-10-14 DIAGNOSIS — Z79899 Other long term (current) drug therapy: Secondary | ICD-10-CM | POA: Diagnosis not present

## 2023-10-14 DIAGNOSIS — M5116 Intervertebral disc disorders with radiculopathy, lumbar region: Secondary | ICD-10-CM | POA: Diagnosis not present

## 2023-10-14 DIAGNOSIS — Z791 Long term (current) use of non-steroidal anti-inflammatories (NSAID): Secondary | ICD-10-CM | POA: Diagnosis not present

## 2023-10-15 ENCOUNTER — Ambulatory Visit: Payer: BC Managed Care – PPO

## 2023-10-15 DIAGNOSIS — Z79899 Other long term (current) drug therapy: Secondary | ICD-10-CM | POA: Diagnosis not present

## 2023-10-15 DIAGNOSIS — Z791 Long term (current) use of non-steroidal anti-inflammatories (NSAID): Secondary | ICD-10-CM | POA: Diagnosis not present

## 2023-10-15 DIAGNOSIS — E669 Obesity, unspecified: Secondary | ICD-10-CM | POA: Diagnosis not present

## 2023-10-15 DIAGNOSIS — Z888 Allergy status to other drugs, medicaments and biological substances status: Secondary | ICD-10-CM | POA: Diagnosis not present

## 2023-10-15 DIAGNOSIS — M4316 Spondylolisthesis, lumbar region: Secondary | ICD-10-CM | POA: Diagnosis not present

## 2023-10-15 DIAGNOSIS — J449 Chronic obstructive pulmonary disease, unspecified: Secondary | ICD-10-CM | POA: Diagnosis not present

## 2023-10-15 DIAGNOSIS — Z6832 Body mass index (BMI) 32.0-32.9, adult: Secondary | ICD-10-CM | POA: Diagnosis not present

## 2023-10-15 DIAGNOSIS — F17210 Nicotine dependence, cigarettes, uncomplicated: Secondary | ICD-10-CM | POA: Diagnosis not present

## 2023-10-31 DIAGNOSIS — G894 Chronic pain syndrome: Secondary | ICD-10-CM | POA: Diagnosis not present

## 2023-11-11 DIAGNOSIS — Z981 Arthrodesis status: Secondary | ICD-10-CM | POA: Diagnosis not present

## 2023-11-28 DIAGNOSIS — Z79899 Other long term (current) drug therapy: Secondary | ICD-10-CM | POA: Diagnosis not present

## 2023-11-28 DIAGNOSIS — M5417 Radiculopathy, lumbosacral region: Secondary | ICD-10-CM | POA: Diagnosis not present

## 2023-11-28 DIAGNOSIS — G5603 Carpal tunnel syndrome, bilateral upper limbs: Secondary | ICD-10-CM | POA: Diagnosis not present

## 2024-01-07 DIAGNOSIS — E7800 Pure hypercholesterolemia, unspecified: Secondary | ICD-10-CM | POA: Diagnosis not present

## 2024-01-07 DIAGNOSIS — F331 Major depressive disorder, recurrent, moderate: Secondary | ICD-10-CM | POA: Diagnosis not present

## 2024-01-07 DIAGNOSIS — I10 Essential (primary) hypertension: Secondary | ICD-10-CM | POA: Diagnosis not present

## 2024-01-07 DIAGNOSIS — J439 Emphysema, unspecified: Secondary | ICD-10-CM | POA: Diagnosis not present

## 2024-01-09 DIAGNOSIS — G603 Idiopathic progressive neuropathy: Secondary | ICD-10-CM | POA: Diagnosis not present

## 2024-02-06 DIAGNOSIS — M5417 Radiculopathy, lumbosacral region: Secondary | ICD-10-CM | POA: Diagnosis not present

## 2024-02-27 DIAGNOSIS — M5417 Radiculopathy, lumbosacral region: Secondary | ICD-10-CM | POA: Diagnosis not present

## 2024-02-27 DIAGNOSIS — G5603 Carpal tunnel syndrome, bilateral upper limbs: Secondary | ICD-10-CM | POA: Diagnosis not present

## 2024-02-27 DIAGNOSIS — Z79899 Other long term (current) drug therapy: Secondary | ICD-10-CM | POA: Diagnosis not present

## 2024-03-26 DIAGNOSIS — M7912 Myalgia of auxiliary muscles, head and neck: Secondary | ICD-10-CM | POA: Diagnosis not present

## 2024-03-26 DIAGNOSIS — M7918 Myalgia, other site: Secondary | ICD-10-CM | POA: Diagnosis not present

## 2024-03-26 DIAGNOSIS — Z79899 Other long term (current) drug therapy: Secondary | ICD-10-CM | POA: Diagnosis not present

## 2024-04-30 DIAGNOSIS — G5603 Carpal tunnel syndrome, bilateral upper limbs: Secondary | ICD-10-CM | POA: Diagnosis not present

## 2024-04-30 DIAGNOSIS — M5417 Radiculopathy, lumbosacral region: Secondary | ICD-10-CM | POA: Diagnosis not present

## 2024-04-30 DIAGNOSIS — Z79899 Other long term (current) drug therapy: Secondary | ICD-10-CM | POA: Diagnosis not present

## 2024-06-04 DIAGNOSIS — M5417 Radiculopathy, lumbosacral region: Secondary | ICD-10-CM | POA: Diagnosis not present

## 2024-07-02 DIAGNOSIS — M5417 Radiculopathy, lumbosacral region: Secondary | ICD-10-CM | POA: Diagnosis not present

## 2024-07-02 DIAGNOSIS — G5603 Carpal tunnel syndrome, bilateral upper limbs: Secondary | ICD-10-CM | POA: Diagnosis not present

## 2024-07-02 DIAGNOSIS — Z79899 Other long term (current) drug therapy: Secondary | ICD-10-CM | POA: Diagnosis not present

## 2024-08-06 DIAGNOSIS — G603 Idiopathic progressive neuropathy: Secondary | ICD-10-CM | POA: Diagnosis not present

## 2024-08-06 DIAGNOSIS — Z79891 Long term (current) use of opiate analgesic: Secondary | ICD-10-CM | POA: Diagnosis not present

## 2024-08-28 DIAGNOSIS — I10 Essential (primary) hypertension: Secondary | ICD-10-CM | POA: Diagnosis not present

## 2024-08-28 DIAGNOSIS — J439 Emphysema, unspecified: Secondary | ICD-10-CM | POA: Diagnosis not present

## 2024-08-28 DIAGNOSIS — F331 Major depressive disorder, recurrent, moderate: Secondary | ICD-10-CM | POA: Diagnosis not present

## 2024-08-28 DIAGNOSIS — E7800 Pure hypercholesterolemia, unspecified: Secondary | ICD-10-CM | POA: Diagnosis not present

## 2024-08-31 ENCOUNTER — Other Ambulatory Visit: Payer: Self-pay | Admitting: Family Medicine

## 2024-08-31 DIAGNOSIS — Z87891 Personal history of nicotine dependence: Secondary | ICD-10-CM

## 2024-09-02 ENCOUNTER — Ambulatory Visit
Admission: RE | Admit: 2024-09-02 | Discharge: 2024-09-02 | Disposition: A | Discharge status: Home / Self Care | Source: Ambulatory Visit | Attending: Family Medicine | Admitting: Family Medicine

## 2024-09-02 DIAGNOSIS — F17200 Nicotine dependence, unspecified, uncomplicated: Secondary | ICD-10-CM | POA: Diagnosis not present

## 2024-09-02 DIAGNOSIS — Z87891 Personal history of nicotine dependence: Secondary | ICD-10-CM | POA: Insufficient documentation

## 2024-09-03 DIAGNOSIS — M5417 Radiculopathy, lumbosacral region: Secondary | ICD-10-CM | POA: Diagnosis not present

## 2024-09-03 DIAGNOSIS — Z23 Encounter for immunization: Secondary | ICD-10-CM | POA: Diagnosis not present

## 2024-09-03 DIAGNOSIS — G5603 Carpal tunnel syndrome, bilateral upper limbs: Secondary | ICD-10-CM | POA: Diagnosis not present

## 2024-09-03 DIAGNOSIS — Z79891 Long term (current) use of opiate analgesic: Secondary | ICD-10-CM | POA: Diagnosis not present
